# Patient Record
Sex: Female | Born: 1960 | Race: White | Hispanic: No | Marital: Married | Smoking: Never smoker
Health system: Southern US, Community
[De-identification: ages and names within clinical notes are randomized; demographics above are authoritative.]

## PROBLEM LIST (undated history)

## (undated) DIAGNOSIS — F329 Major depressive disorder, single episode, unspecified: Secondary | ICD-10-CM

## (undated) DIAGNOSIS — F419 Anxiety disorder, unspecified: Secondary | ICD-10-CM

## (undated) DIAGNOSIS — F32A Depression, unspecified: Secondary | ICD-10-CM

## (undated) DIAGNOSIS — I10 Essential (primary) hypertension: Secondary | ICD-10-CM

## (undated) DIAGNOSIS — M1990 Unspecified osteoarthritis, unspecified site: Secondary | ICD-10-CM

## (undated) DIAGNOSIS — D860 Sarcoidosis of lung: Secondary | ICD-10-CM

## (undated) DIAGNOSIS — E7800 Pure hypercholesterolemia, unspecified: Secondary | ICD-10-CM

## (undated) DIAGNOSIS — E119 Type 2 diabetes mellitus without complications: Secondary | ICD-10-CM

## (undated) DIAGNOSIS — G43909 Migraine, unspecified, not intractable, without status migrainosus: Secondary | ICD-10-CM

## (undated) DIAGNOSIS — K219 Gastro-esophageal reflux disease without esophagitis: Secondary | ICD-10-CM

## (undated) HISTORY — PX: CHOLECYSTECTOMY: SHX55

## (undated) HISTORY — PX: ABDOMINAL HYSTERECTOMY: SHX81

## (undated) HISTORY — PX: LUNG BIOPSY: SHX232

## (undated) HISTORY — PX: TONSILLECTOMY: SUR1361

## (undated) HISTORY — PX: BREAST BIOPSY: SHX20

## (undated) HISTORY — PX: APPENDECTOMY: SHX54

---

## 2004-08-15 ENCOUNTER — Ambulatory Visit: Payer: Self-pay | Admitting: Family Medicine

## 2004-09-23 ENCOUNTER — Ambulatory Visit: Payer: Self-pay | Admitting: Family Medicine

## 2004-11-27 ENCOUNTER — Ambulatory Visit: Payer: Self-pay | Admitting: Family Medicine

## 2004-12-12 ENCOUNTER — Ambulatory Visit: Payer: Self-pay | Admitting: Family Medicine

## 2005-06-11 ENCOUNTER — Ambulatory Visit: Payer: Self-pay | Admitting: Family Medicine

## 2005-12-11 ENCOUNTER — Ambulatory Visit: Payer: Self-pay | Admitting: Internal Medicine

## 2005-12-16 ENCOUNTER — Ambulatory Visit: Payer: Self-pay | Admitting: Internal Medicine

## 2006-06-17 ENCOUNTER — Ambulatory Visit: Payer: Self-pay | Admitting: Family Medicine

## 2007-02-21 ENCOUNTER — Ambulatory Visit: Payer: Self-pay | Admitting: Internal Medicine

## 2007-06-21 ENCOUNTER — Ambulatory Visit: Payer: Self-pay | Admitting: Internal Medicine

## 2007-06-28 ENCOUNTER — Ambulatory Visit: Payer: Self-pay | Admitting: Internal Medicine

## 2007-07-07 ENCOUNTER — Ambulatory Visit: Payer: Self-pay | Admitting: Internal Medicine

## 2007-08-01 ENCOUNTER — Ambulatory Visit: Payer: Self-pay | Admitting: Gynecology

## 2007-08-04 ENCOUNTER — Ambulatory Visit (HOSPITAL_COMMUNITY): Admission: RE | Admit: 2007-08-04 | Discharge: 2007-08-04 | Payer: Self-pay | Admitting: Gynecology

## 2007-08-25 ENCOUNTER — Ambulatory Visit: Payer: Self-pay | Admitting: Gynecology

## 2007-10-06 ENCOUNTER — Ambulatory Visit: Payer: Self-pay | Admitting: Gynecology

## 2007-10-06 ENCOUNTER — Encounter (INDEPENDENT_AMBULATORY_CARE_PROVIDER_SITE_OTHER): Payer: Self-pay | Admitting: Gynecology

## 2007-10-06 ENCOUNTER — Ambulatory Visit (HOSPITAL_COMMUNITY): Admission: RE | Admit: 2007-10-06 | Discharge: 2007-10-06 | Payer: Self-pay | Admitting: Gynecology

## 2007-10-26 ENCOUNTER — Ambulatory Visit: Payer: Self-pay | Admitting: Gynecology

## 2007-12-07 ENCOUNTER — Ambulatory Visit: Payer: Self-pay | Admitting: Internal Medicine

## 2008-05-30 ENCOUNTER — Ambulatory Visit: Payer: Self-pay | Admitting: Internal Medicine

## 2008-07-26 ENCOUNTER — Ambulatory Visit: Payer: Self-pay | Admitting: Internal Medicine

## 2008-08-27 ENCOUNTER — Ambulatory Visit: Payer: Self-pay | Admitting: Internal Medicine

## 2008-11-05 ENCOUNTER — Ambulatory Visit: Payer: Self-pay | Admitting: Family Medicine

## 2008-11-12 ENCOUNTER — Emergency Department: Payer: Self-pay | Admitting: Emergency Medicine

## 2008-11-13 ENCOUNTER — Inpatient Hospital Stay: Payer: Self-pay | Admitting: Internal Medicine

## 2009-01-26 IMAGING — US US PELV - US TRANSVAGINAL
1 series · 17 of 25 positions shown · non-contrast
Comparison: none

REASON FOR EXAM: pelvic pain
COMMENTS:

[Series 1: us pelv - us transvaginal · 17 of 36 slices shown]
[im 1/36]
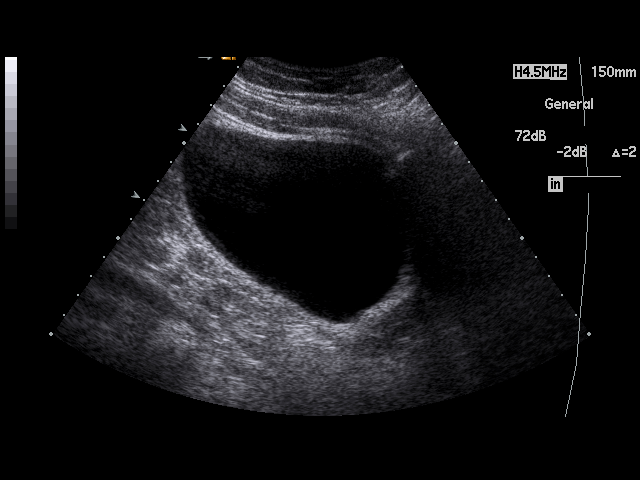
[im 3/36]
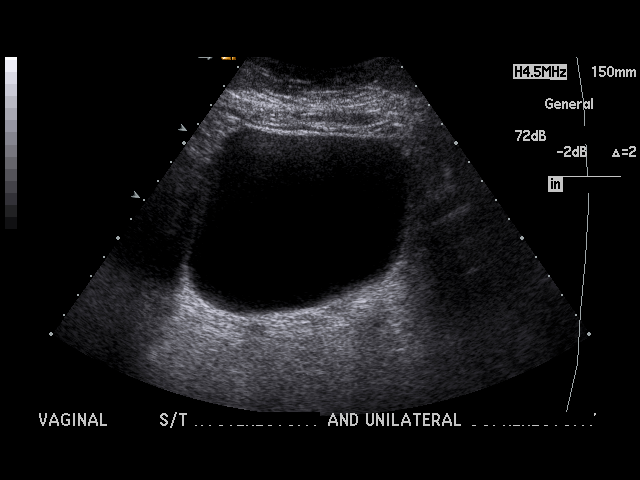
[im 5/36]
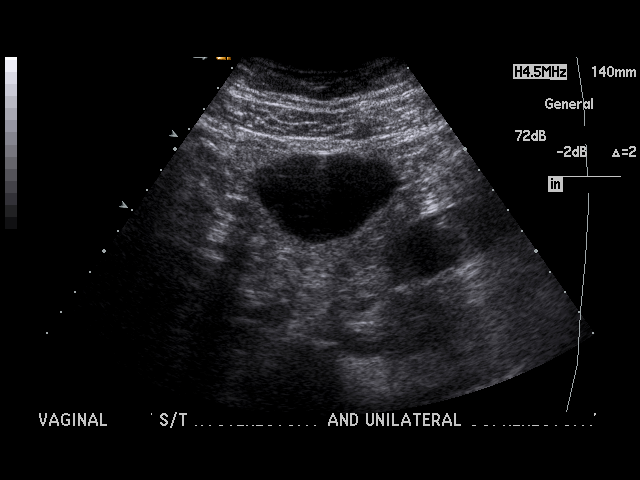
[im 8/36]
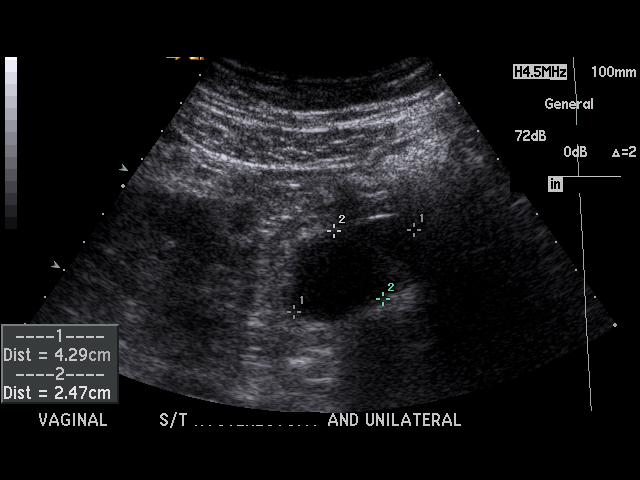
[im 9/36]
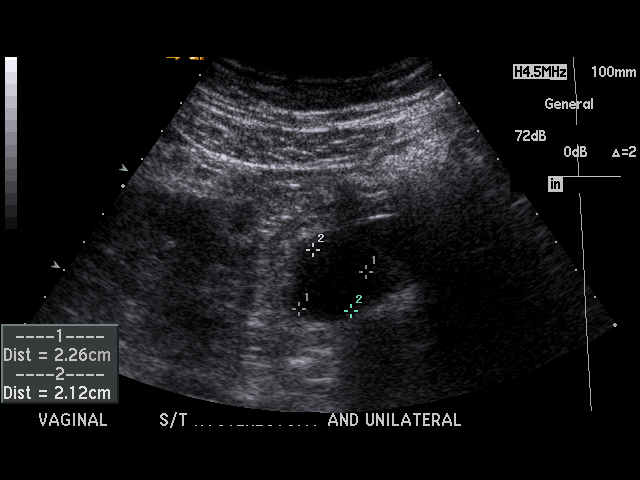
[im 12/36]
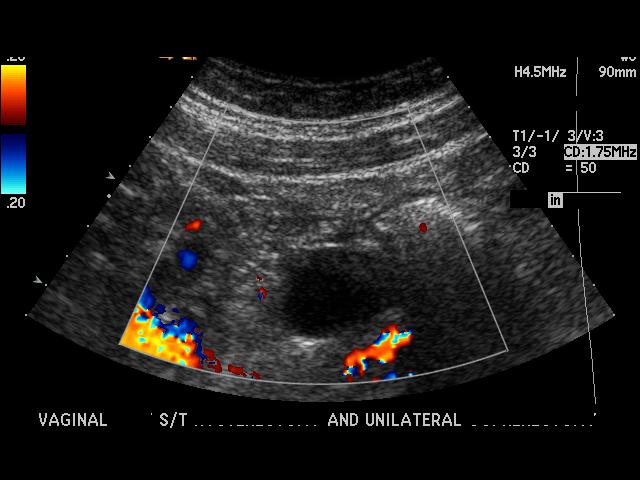
[im 14/36]
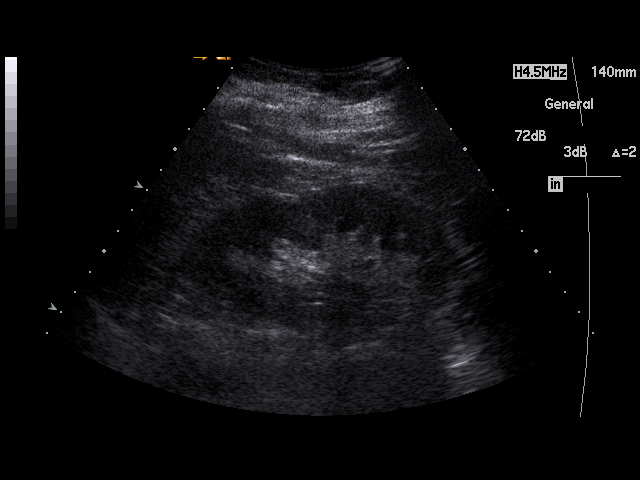
[im 17/36]
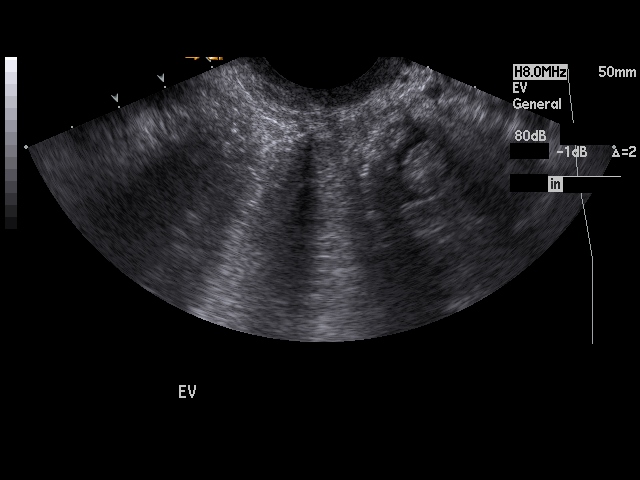
[im 18/36]
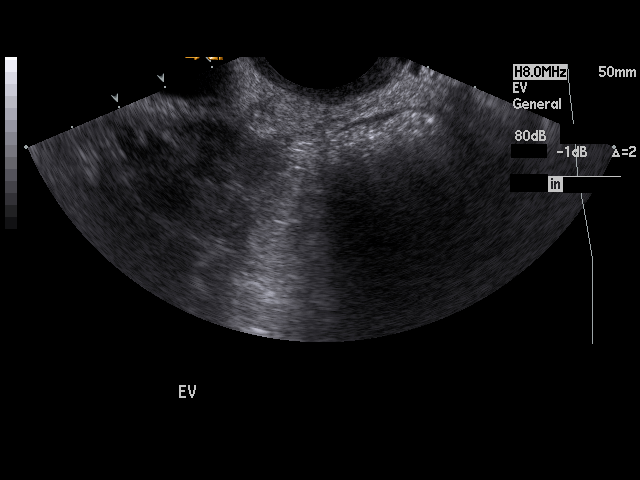
[im 19/36]
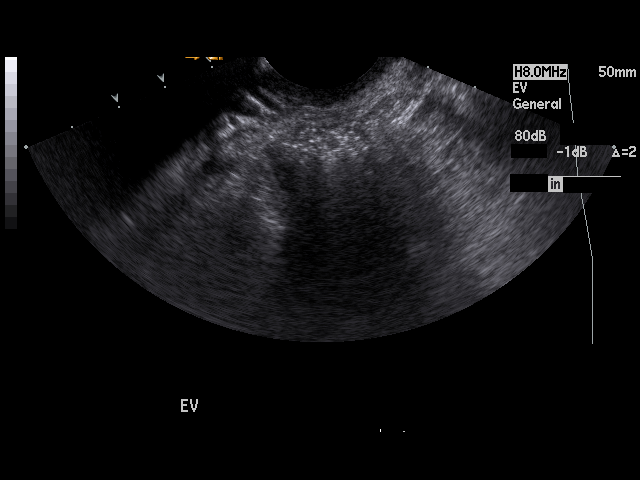
[im 22/36]
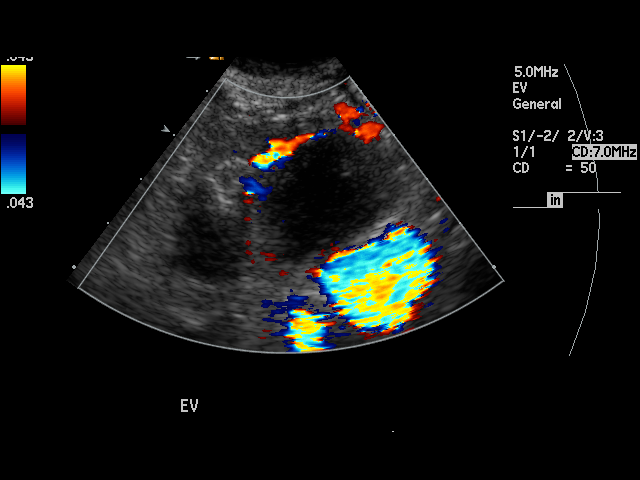
[im 24/36]
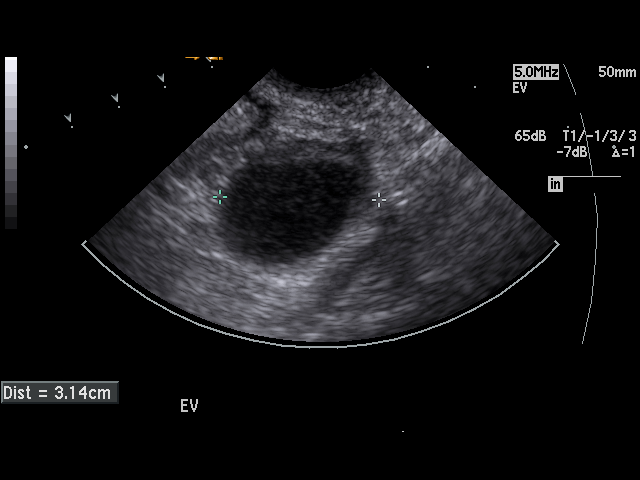
[im 27/36]
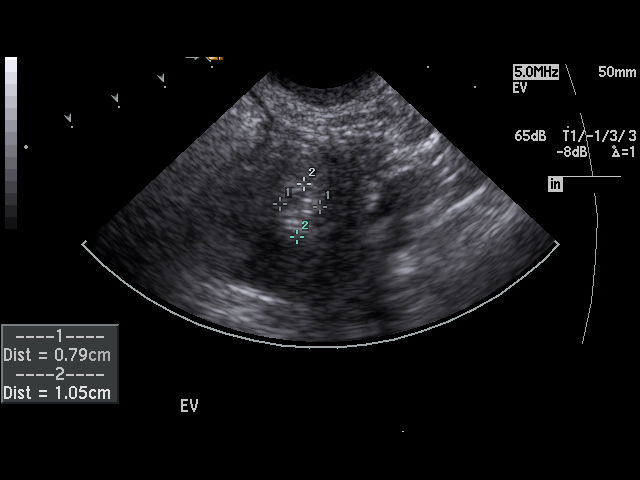
[im 28/36]
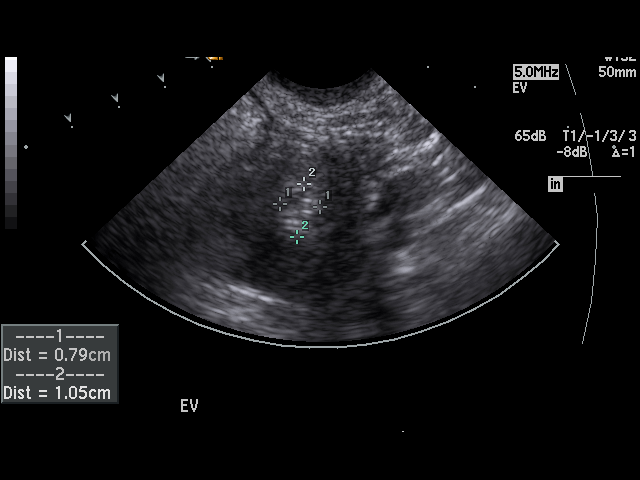
[im 31/36]
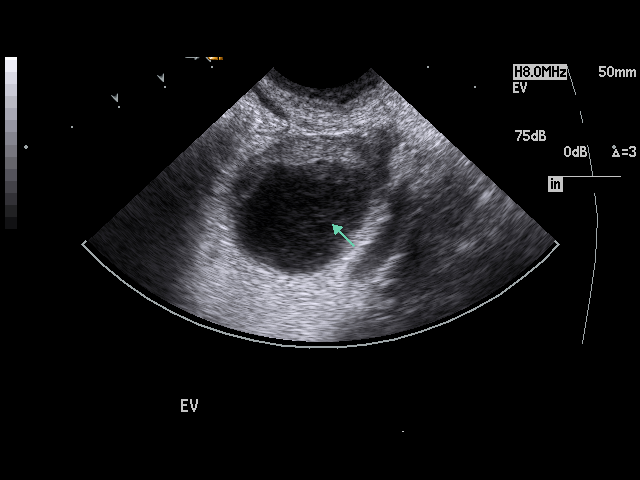
[im 33/36]
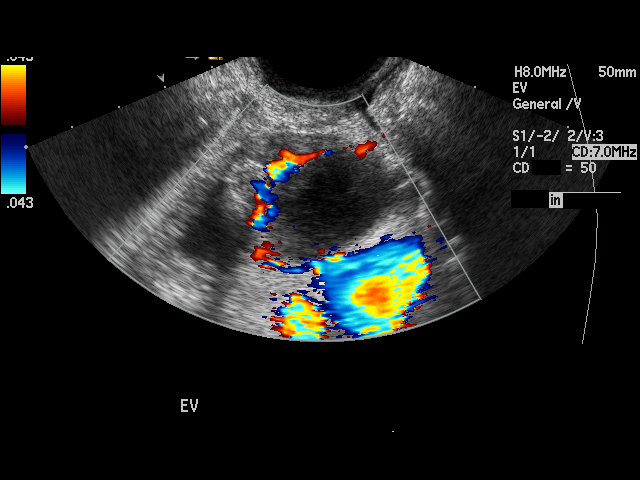
[im 36/36]
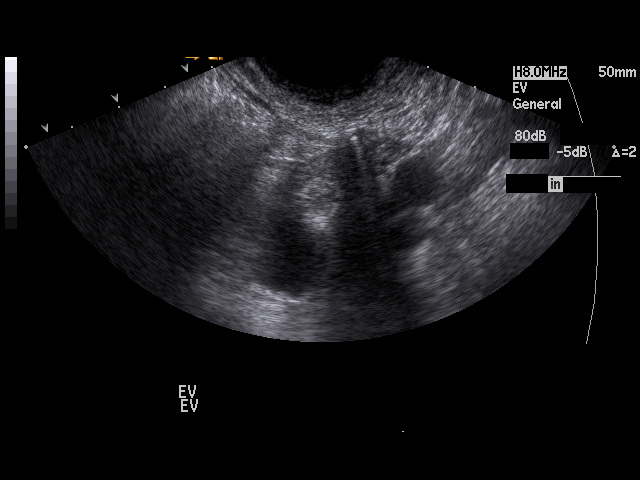

[17 of 25 positions shown; findings below may reference images not displayed]

PROCEDURE:     US  - US PELVIS MASS EXAM W/TRANSVAGI  - July 07, 2007  [DATE]

RESULT:     The patient is status post hysterectomy. The RIGHT ovary is not
seen compatible with prior RIGHT oophorectomy. The LEFT ovary is visualized
and measures 4.5 cm at maximum diameter. Within the LEFT ovary there are
noted two hypoechoic areas. The smaller appears to represent a simple cyst
and the larger is a complex cyst with an irregular margin. The larger cyst
may represent a hemorrhagic cyst but since all margins are not smooth,
follow-up examination to document resolution is suggested since conceivably
a necrotic ovarian tumor could produce similar findings. Also noted in the
ovary is a focal hyperechoic area located just anterior to the two cysts.
This focus is basically avascular. This focus measures 1.05 cm at maximum
diameter. The finding could represent a small solid avascular mass or
possibly a focal area of calcification in the ovary. No new free fluid is
seen in the pelvis. The visualized portion of the urinary bladder is normal
in appearance. The kidneys show no hydronephrosis.
IMPRESSION: 1. The patient is status post hysterectomy and RIGHT oophorectomy.
2. There are noted two cystic lesions of the LEFT ovary with the larger
being a complex cyst measuring 2.49 cm at maximum diameter and for which
follow-up is recommended.
3. There is a focal 1.05 cm hyperechoic area in the LEFT ovary anterior to
the cystic areas and which could either represent a solid avascular or
hypovascular ovarian nodule or possibly focal calcification.
4. There is no ascites.
5. Transabdominal and endovaginal ultrasound was performed.

## 2009-10-02 ENCOUNTER — Ambulatory Visit: Payer: Self-pay | Admitting: Nurse Practitioner

## 2010-01-13 ENCOUNTER — Ambulatory Visit: Payer: Self-pay | Admitting: Family Medicine

## 2010-01-16 ENCOUNTER — Emergency Department: Payer: Self-pay | Admitting: Emergency Medicine

## 2010-03-20 ENCOUNTER — Ambulatory Visit: Payer: Self-pay | Admitting: Unknown Physician Specialty

## 2010-05-04 ENCOUNTER — Ambulatory Visit: Payer: Self-pay | Admitting: Internal Medicine

## 2010-05-24 ENCOUNTER — Observation Stay: Payer: Self-pay | Admitting: Internal Medicine

## 2010-05-26 ENCOUNTER — Ambulatory Visit: Payer: Self-pay | Admitting: Internal Medicine

## 2010-05-30 ENCOUNTER — Ambulatory Visit: Payer: Self-pay | Admitting: Internal Medicine

## 2010-06-03 ENCOUNTER — Ambulatory Visit: Payer: Self-pay | Admitting: Internal Medicine

## 2010-06-10 ENCOUNTER — Telehealth: Payer: Self-pay

## 2010-06-10 DIAGNOSIS — R933 Abnormal findings on diagnostic imaging of other parts of digestive tract: Secondary | ICD-10-CM

## 2010-06-10 NOTE — Telephone Encounter (Signed)
Pt aware of the procedure date and time she has been mailed instructions to her home.  Meds reviewed and pt instructed.

## 2010-06-10 NOTE — Telephone Encounter (Signed)
Pt scheduled for EUS at [facility] 06/19/10 130 pm.  Pt is to call Nicki [place] between 1-3 the day before for arrival [phone] and go to [facility] sometime before the 16th for preregistration.   Need to review meds and instruct pt.  Records and order faxed to [facility].  [phone].

## 2010-06-17 NOTE — Op Note (Signed)
NAME:  Alexis Mccarthy, Alexis Mccarthy                ACCOUNT NO.:  [id number]   MEDICAL RECORD NO.:  [id number]          PATIENT TYPE:  AMB   LOCATION:  [facility]                           FACILITY:  [facility]   PHYSICIAN:  Ginger Carne, MD  DATE OF BIRTH:  11-Apr-1960   DATE OF PROCEDURE:  10/06/2007  DATE OF DISCHARGE:                               OPERATIVE REPORT   PREOPERATIVE DIAGNOSES:  1. Left ovarian mass.  2. 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  3. Chronic pelvic pain.  4. 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   POSTOPERATIVE DIAGNOSES:  1. Left ovarian mass.  2. Probable dermoid.  3. Normal right pelvic sidewall.  4. Normal-appearing appendix.   OPERATIVE PROCEDURES:  1. Laparoscopic left salpingo-oophorectomy.  2. Laparoscopic appendectomy.   SURGEON:  Ginger Carne, MD   ASSISTANT:  None.   COMPLICATIONS:  None immediate.   ESTIMATED BLOOD LOSS:  Minimal.   SPECIMENS:  Left tube and ovary and appendix.   OPERATIVE FINDINGS:  Laparoscopic evaluation revealed pelvic,  peritoneal, intestinal adhesions on both pelvic sidewalls.  The left  ovary was noted to be approximately 3-4 cm with an ultrasound diagnosis  consistent with possible dermoid cyst.  The ovary was adherent to its  respective sidewall as well as adhesive disease.  The right pelvic  sidewall was carefully dissected and no evidence of what appeared to be  an ovarian remnant was noted.  Scar tissue was observed; however, there  was no suggestion of actual ovarian tissue.  The appendix appeared  otherwise normal and was performed as an incidental finding.  Large and  small bowel grossly normal.  Upper abdomen, normal.   OPERATIVE PROCEDURE:  The patient prepped and draped in usual fashion  and placed in the lithotomy position.  Betadine solution used for  antiseptic and the patient was catheterized prior to procedure.  After  adequate general anesthesia, a vertical infraumbilical incision was made  and a Veress needle placed in  the abdomen.  Opening and closing  pressures were 10 and 15 mmHg.  Needle released, trocar placed in same  incision.  Laparoscope placed in the trocar sleeve.  Afterwards, two 5-  mm probes were placed in the left lower quadrant and left hypogastric  regions under direct visualization.  Meticulous attention was paid to  pelvic and intestinal adhesive disease dissection.  All adhesions were  lysed and were necessary to carefully visualize the left ureter and left  adnexal structures.  At this point, the left infundibulopelvic ligament  was bipolar cauterized and cut.  The remaining portions of the  attachment of the left tube and ovary to the pelvic sidewall were  severed and __________ removed with an Endopouch bag.  Active bleeding  points hemostatically checked and copious irrigation utilized.  The  right pelvic sidewall was carefully dissected with bowel carefully  removed from sub sidewall.  As described above, no evidence of ovarian  remnant was noted.  At this point, the appendix was visualized and  bipolar cauterization of the mesoappendix to the base followed.  Two 0  Vicryl loops were placed at the  base and then one about 8 mm above the  first 2.  Appendix then was cut above the first 2 sutures and removed  with an Endopouch bag.  Copious irrigation of the area with lactated  Ringer's followed.  No active bleeding anywhere noted, irrigant removed.  Gas released, trocars removed.  No active bleeding observed.  Closure of  the 10-mm fascia site with 0 Vicryl suture and 4-0 Vicryl for  subcuticular closure.  Instrument and sponge count were correct.  The  patient tolerated the procedure well, and returned to postanesthesia  recovery room in excellent condition.      Ginger Carne, MD  Electronically Signed     SHB/MEDQ  D:  10/06/2007  T:  10/07/2007  Job:  [id number]

## 2010-06-17 NOTE — Assessment & Plan Note (Signed)
NAME:  Alexis Mccarthy, Alexis Mccarthy                ACCOUNT NO.:  [id number]   MEDICAL RECORD NO.:  [id number]          PATIENT TYPE:  POB   LOCATION:  [facility] at [facility]         FACILITY:  [facility]   PHYSICIAN:  Ginger Carne, MD DATE OF BIRTH:  Jul 01, 1960   DATE OF SERVICE:  08/25/2007                                  CLINIC NOTE   This patient is a 50 year old gravida 3, para 2-0-1-2 Caucasian female  who demonstrates a questionable small right ovarian remnant and a left  ovarian dermoid tumor with a 3 to 32-month history of chronic pelvic  pain.  The patient had undergone a total vaginal hysterectomy and right  salpingo-oophorectomy in 2002 because of adenomyosis and abnormal  bleeding.  She demonstrates on an ultrasound obtained on August 04, 2007 by  the [facility] in [place] a left ovary measuring in largest  dimension 3.1 cm and compatible with a left ovarian dermoid tumor.  The  right adnexa demonstrates an area with largest dimension of 1.6 cm  suggestive of a small ovarian remnant.  The patient wishes to have  definitive management of same.  The pain has caused issues as far as  having a negative impact on her quality of life and dyspareunia.  She  denied GI, GU, or musculoskeletal symptomatology.   OB/GYN HISTORY:  The patient has had 2 full-term vaginal deliveries, one  first trimester incomplete abortion and a bilateral tubal ligation in  1983.  She has also had a laparoscopic procedure for lysis of adhesions  in 2000.   PAST SURGICAL HISTORY:  Laparoscopic cholecystectomy in 2004, total  vaginal hysterectomy and right salpingo-oophorectomy in 2002, two  laparoscopic procedures in the past, one for bilateral tubal ligation  and another one for lysis of adhesions.   ALLERGIES:  No to iodine, latex, food, or medication.   CURRENT MEDICATIONS:  1. Cymbalta 30 mg 1 daily.  2. Mobic 7.5 mg twice a day.   MEDICAL HISTORY:  1. Depression.  2. Arthritis.   FAMILY HISTORY:   Father has coronary artery heart disease and has had a  bypass surgery recently and type 2 diabetes mellitus.  Mother has  osteoporosis and COPD.   SOCIAL HISTORY:  The patient is a nonsmoker.  Denies alcohol or illicit  drug abuse.   REVIEW OF SYSTEMS:  A 14-point comprehensive review of systems within  normal limits.   PHYSICAL EXAMINATION:  VITAL SIGNS:  Blood pressure 147/92, weight 151  pounds, and height 5 feet 7 inches.  HEENT:  Grossly normal.  BREASTS:  Without masses, discharge, thickenings, or tenderness.  CHEST:  Clear to percussion and auscultation.  CARDIOVASCULAR:  Without murmurs.  Regular rate and rhythm.  EXTREMITIES, LYMPHATIC, SKIN, NEUROLOGICAL, AND MUSCULOSKELETAL:  Within  normal limits.  ABDOMEN:  Soft without hepatosplenomegaly.  PELVIC:  External genitalia, vulva, and vagina normal.  Cuff is smooth.  Cervix absent.  Both adnexa are tender but without fullness.  Rectovaginal exam confirmatory.   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   PLAN:  The patient was apprised as to the nature of findings and wishes  to have  definitive management.  Therefore, the patient will be scheduled  for a laparoscopic excision of right adnexal tissue and left salpingo-  oophorectomy as well as appendectomy.  Ashby Dawes of said procedure  discussed in detail.  Risks including injuries to ureter, bowel, and  bladder, possible conversion to an open procedure, hemorrhage possibly  requiring blood transfusion, infection, and other unforeseen  complications were discussed and understood by said patient.  In  addition, the patient understands she may have bilateral ureteral  catheters placed in the event of difficulty in identifying either  ureter.           ______________________________  Ginger Carne, MD     SHB/MEDQ  D:  08/25/2007  T:  08/26/2007  Job:  [id number]

## 2010-06-17 NOTE — Assessment & Plan Note (Signed)
NAME:  Alexis Mccarthy, Alexis Mccarthy                ACCOUNT NO.:  [id number]   MEDICAL RECORD NO.:  [id number]          PATIENT TYPE:  POB   LOCATION:  [facility] at [facility]         FACILITY:  [facility]   PHYSICIAN:  Ginger Carne, MD DATE OF BIRTH:  1960/11/11   DATE OF SERVICE:                                  CLINIC NOTE   Ms. Bukowski is a 50 year old Caucasian female status post total vaginal  hysterectomy and right salpingo-oophorectomy in August 2002 because of  endometriosis and adenomyosis who presents today because of an  ultrasound performed at Dr. Dalphine Handing office on July 07, 2007, which  demonstrated 2 complex cystic masses being 2.5 and 1.05 cm on the left  ovary.  She states that on examination she had had tenderness which  prompted the said ultrasound.  Otherwise, she has minimal discomfort on  that site.   The patient denies GI, GU, or musculoskeletal symptomatology.  Ultrasound scan performed here today revealed a different appearing  lesion on the left adnexa, 4-5 cm tubular structure was noted separating  apart from the bladder which contained what appeared to be hyperechoic  signals.  Upon looking at this, it is unclear whether this represents  her tube and/or ovary.   PLAN:  I have asked the patient to have an ultrasound at [facility] of the pelvis to determine the etiology of this mass.  Her left  tube and ovary had been retained following her surgery.  The patient  will be contacted after ultrasound report is available.           ______________________________  Ginger Carne, MD     SHB/MEDQ  D:  08/01/2007  T:  08/02/2007  Job:  [id number]

## 2010-06-17 NOTE — Assessment & Plan Note (Signed)
NAME:  Alexis Mccarthy, Alexis Mccarthy                ACCOUNT NO.:  [id number]   MEDICAL RECORD NO.:  [id number]          PATIENT TYPE:  POB   LOCATION:  [facility] at [facility]         FACILITY:  [facility]   PHYSICIAN:  Ginger Carne, MD DATE OF BIRTH:  [birth year]   DATE OF SERVICE:  10/25/2007                                  CLINIC NOTE   [name] returns today following a laparoscopic left salpingo-  oophorectomy and appendectomy because of a chronic pelvic pain.  Her  pathology report revealed a normal appendix and a mature cystic teratoma  within ovary that had multiple benign cystic follicles.  She had no  evidence of a right ovarian remnant syndrome as was indicated on her  ultrasound.  The patient is doing well today.  Her incisions are dry,  well-healed.  She has no complaints and feeling well.  The patient is  taking Premarin 1.25 mg daily and she will return on a p.r.n. basis.           ______________________________  Ginger Carne, MD     SHB/MEDQ  D:  10/26/2007  T:  10/26/2007  Job:  [id number]

## 2010-06-19 ENCOUNTER — Encounter: Payer: Self-pay | Admitting: Gastroenterology

## 2010-06-19 ENCOUNTER — Ambulatory Visit: Payer: Self-pay

## 2010-06-20 NOTE — H&P (Signed)
NAME:  Alexis Mccarthy, Alexis Mccarthy NO.:  [id number]   MEDICAL RECORD NO.:  [id number]           PATIENT TYPE:   LOCATION:                                 FACILITY:   PHYSICIAN:  Ginger Carne, MD  DATE OF BIRTH:  08/21/1960   DATE OF ADMISSION:  DATE OF DISCHARGE:                              HISTORY & PHYSICAL   This patient is a 50 year old gravida 3, para 2-0-1-2 Caucasian female  who demonstrates a questionable right ovarian remnant and the left  ovarian dermoid mass with a 3- to 66-month history of chronic pelvic  pain.  The patient had undergone a total vaginal hysterectomy and right  salpingo-oophorectomy in 2002 because of adenomyosis and abnormal  bleeding.  She demonstrates on an ultrasound obtained on August 04, 2007,  by [facility] in [place].  The left ovary measuring in largest  dimension at 3.1 cm and compatible with the left ovarian dermoid tumor.  The right adnexa demonstrates an area with largest dimension of 1.6 cm  suggestive of a small ovarian remnant.  The patient wishes to have  definitive management of the same.  The patient has noted that her  discomfort has caused a negative impact in her quality of life in  addition to dyspareunia.  She denies GI, GU, or musculoskeletal  complaints or symptomatology.   OB/GYN HISTORY:  The patient has had 2 full term vaginal deliveries, one  first trimester and complete abortion and a bilateral tubal ligation in  1983.  She has also had a laparoscopic procedure for lysis of adhesions  in 2000.   PAST SURGICAL HISTORY:  Laparoscopic cholecystectomy in 2004.  Total  vaginal hysterectomy and right salpingo-oophorectomy in 2002.  Two  laparoscopic procedures in the past, one for bilateral tubal ligation  and another for a lysis of adhesions.   ALLERGIES:  None to iodine, latex, food, and medication.   CURRENT MEDICATIONS:  1. Cymbalta 30 mg daily.  2. Mobic 7.5 mg twice a day.   PAST MEDICAL HISTORY:  1. Depression.  2. Arthritis.   FAMILY HISTORY:  Father has coronary artery heart disease and has had a  bypass surgery recently and type 2 diabetes mellitus.  Mother has  osteoporosis and COPD.   SOCIAL HISTORY:  The patient is a nonsmoker, denies alcohol, or illicit  drug abuse.   REVIEW OF SYSTEMS:  A 14-point comprehensive review of systems within  normal limits.   PHYSICAL EXAMINATION:  VITAL SIGNS:  Blood pressure 147/92, weight 151  pounds, and height 5 feet 7 inches.  HEENT:  Grossly normal.  BREASTS:  Without masses, discharge, thickenings, or tenderness.  CHEST:  Clear to percussion and auscultation.  CARDIOVASCULAR:  Without murmurs or enlargement.  Regular rate and  rhythm.  EXTREMITIES/LYMPHATICS/SKIN/NEUROLOGICAL/MUSCULOSKELETAL SYSTEMS:  Within normal limits.  ABDOMEN:  Soft without gross hepatosplenomegaly.  PELVIC EXAM:  External genitalia, vulva, and vagina normal.  The cuff is  smooth with an absent cervix.  Both adnexa are tender but without masses  or fullness.  Rectovaginal exam was confirmatory.   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   PLAN:  The patient was apprised of the nature of findings from  ultrasound, wishes to have definitive management.  Therefore, the  patient will be scheduled for laparoscopic excision of right adnexal  tissue if noted and a left salpingo-oophorectomy as well as an  appendectomy.  Ashby Dawes of said procedure discussed in detail.  Risks  including possible injuries to ureter, bowel and bladder, possible  conversion to an open procedure, hemorrhage requiring a blood  transfusion, infection, and other unforeseen complications were  discussed and understood by the patient.  In addition, the patient  understands she may have bilateral ureteral catheters placed in the  event of difficulty in identifying either ureter.      Ginger Carne, MD   Electronically Signed     SHB/MEDQ  D:  10/28/2007  T:  10/29/2007  Job:  [id number]

## 2010-06-23 ENCOUNTER — Encounter: Payer: Self-pay | Admitting: Gastroenterology

## 2010-06-24 LAB — PATHOLOGY REPORT

## 2010-06-26 ENCOUNTER — Encounter: Payer: Self-pay | Admitting: Gastroenterology

## 2010-06-27 ENCOUNTER — Ambulatory Visit: Payer: Self-pay

## 2010-07-01 ENCOUNTER — Encounter: Payer: Self-pay | Admitting: Gastroenterology

## 2010-07-02 ENCOUNTER — Inpatient Hospital Stay: Payer: Self-pay

## 2010-07-04 ENCOUNTER — Ambulatory Visit: Payer: Self-pay | Admitting: Internal Medicine

## 2010-07-08 LAB — PATHOLOGY REPORT

## 2010-08-03 ENCOUNTER — Ambulatory Visit: Payer: Self-pay | Admitting: Internal Medicine

## 2010-08-28 ENCOUNTER — Ambulatory Visit: Payer: Self-pay | Admitting: Internal Medicine

## 2010-09-03 ENCOUNTER — Ambulatory Visit: Payer: Self-pay | Admitting: Internal Medicine

## 2010-09-26 ENCOUNTER — Ambulatory Visit: Payer: Self-pay | Admitting: Unknown Physician Specialty

## 2010-09-29 LAB — PATHOLOGY REPORT

## 2010-10-07 ENCOUNTER — Ambulatory Visit: Payer: Self-pay | Admitting: Family Medicine

## 2010-11-05 LAB — CBC
HCT: 41
MCHC: 34.1
Platelets: 340
RDW: 12

## 2010-11-05 LAB — BASIC METABOLIC PANEL
BUN: 7
CO2: 32
GFR calc non Af Amer: >60
Glucose, Bld: 94
Potassium: 3.6

## 2010-11-06 ENCOUNTER — Ambulatory Visit: Payer: Self-pay | Admitting: Internal Medicine

## 2010-12-04 ENCOUNTER — Ambulatory Visit: Payer: Self-pay | Admitting: Internal Medicine

## 2011-01-03 ENCOUNTER — Ambulatory Visit: Payer: Self-pay | Admitting: Internal Medicine

## 2011-01-30 ENCOUNTER — Ambulatory Visit: Payer: Self-pay | Admitting: Unknown Physician Specialty

## 2011-02-03 ENCOUNTER — Ambulatory Visit: Payer: Self-pay | Admitting: Internal Medicine

## 2011-07-09 ENCOUNTER — Ambulatory Visit: Payer: Self-pay | Admitting: Internal Medicine

## 2011-08-03 ENCOUNTER — Ambulatory Visit: Payer: Self-pay | Admitting: Internal Medicine

## 2011-09-20 ENCOUNTER — Emergency Department: Payer: Self-pay | Admitting: Emergency Medicine

## 2011-09-20 LAB — BASIC METABOLIC PANEL
Anion Gap: 10 (ref 7–16)
Chloride: 101 mmol/L (ref 98–107)
Co2: 29 mmol/L (ref 21–32)
Creatinine: 0.75 mg/dL (ref 0.60–1.30)
Sodium: 140 mmol/L (ref 136–145)

## 2011-09-20 LAB — HEPATIC FUNCTION PANEL A (ARMC)
Alkaline Phosphatase: 78 U/L (ref 50–136)
Bilirubin,Total: 0.2 mg/dL (ref 0.2–1.0)

## 2011-09-20 LAB — CK TOTAL AND CKMB (NOT AT ARMC)
CK, Total: 90 U/L (ref 21–215)
CK-MB: 0.6 ng/mL (ref 0.5–3.6)

## 2011-09-20 LAB — TROPONIN I: Troponin-I: 0.03 ng/mL

## 2011-09-20 LAB — CBC
RBC: 4.18 10*6/uL (ref 3.80–5.20)
WBC: 10.1 10*3/uL (ref 3.6–11.0)

## 2011-10-08 ENCOUNTER — Ambulatory Visit: Payer: Self-pay | Admitting: Family Medicine

## 2012-04-07 ENCOUNTER — Emergency Department: Payer: Self-pay | Admitting: Emergency Medicine

## 2012-04-07 LAB — CBC WITH DIFFERENTIAL/PLATELET
Basophil #: 0.1 10*3/uL (ref 0.0–0.1)
Eosinophil #: 0.2 10*3/uL (ref 0.0–0.7)
Lymphocyte %: 21.2 %
MCH: 30.1 pg (ref 26.0–34.0)
MCHC: 33.6 g/dL (ref 32.0–36.0)
MCV: 90 fL (ref 80–100)
Monocyte #: 1 x10 3/mm — ABNORMAL HIGH (ref 0.2–0.9)
Monocyte %: 7.3 %
Neutrophil #: 9.3 10*3/uL — ABNORMAL HIGH (ref 1.4–6.5)
Neutrophil %: 69.5 %
Platelet: 329 10*3/uL (ref 150–440)
RDW: 13.4 % (ref 11.5–14.5)

## 2012-04-07 LAB — BASIC METABOLIC PANEL
Chloride: 104 mmol/L (ref 98–107)
Co2: 31 mmol/L (ref 21–32)
EGFR (African American): >60
Glucose: 122 mg/dL — ABNORMAL HIGH (ref 65–99)
Osmolality: 279 (ref 275–301)

## 2012-10-10 ENCOUNTER — Ambulatory Visit: Payer: Self-pay | Admitting: Nurse Practitioner

## 2012-10-14 ENCOUNTER — Emergency Department: Payer: Self-pay | Admitting: Internal Medicine

## 2012-10-14 LAB — COMPREHENSIVE METABOLIC PANEL
Anion Gap: 8 (ref 7–16)
BUN: 10 mg/dL (ref 7–18)
Bilirubin,Total: 0.4 mg/dL (ref 0.2–1.0)
Calcium, Total: 9.1 mg/dL (ref 8.5–10.1)
Chloride: 103 mmol/L (ref 98–107)
EGFR (Non-African Amer.): >60
Osmolality: 277 (ref 275–301)

## 2012-10-14 LAB — URINALYSIS, COMPLETE
Bacteria: NONE SEEN
Glucose,UR: NEGATIVE mg/dL (ref 0–75)
Leukocyte Esterase: NEGATIVE
Specific Gravity: 1.014 (ref 1.003–1.030)
WBC UR: 1 /HPF (ref 0–5)

## 2012-10-14 LAB — CBC
MCH: 30.8 pg (ref 26.0–34.0)
MCHC: 34.7 g/dL (ref 32.0–36.0)
Platelet: 319 10*3/uL (ref 150–440)
WBC: 9.4 10*3/uL (ref 3.6–11.0)

## 2012-10-14 LAB — LIPASE, BLOOD: Lipase: 73 U/L (ref 73–393)

## 2013-03-27 ENCOUNTER — Emergency Department: Payer: Self-pay | Admitting: Emergency Medicine

## 2013-03-27 LAB — CBC
HCT: 37.2 % (ref 35.0–47.0)
HGB: 13 g/dL (ref 12.0–16.0)
MCH: 31.1 pg (ref 26.0–34.0)
MCHC: 34.9 g/dL (ref 32.0–36.0)
MCV: 89 fL (ref 80–100)
PLATELETS: 334 10*3/uL (ref 150–440)
RBC: 4.18 10*6/uL (ref 3.80–5.20)
RDW: 13.2 % (ref 11.5–14.5)
WBC: 8.5 10*3/uL (ref 3.6–11.0)

## 2013-03-27 LAB — BASIC METABOLIC PANEL
Anion Gap: 4 — ABNORMAL LOW (ref 7–16)
BUN: 12 mg/dL (ref 7–18)
CALCIUM: 8.8 mg/dL (ref 8.5–10.1)
CO2: 31 mmol/L (ref 21–32)
Chloride: 102 mmol/L (ref 98–107)
Creatinine: 0.94 mg/dL (ref 0.60–1.30)
EGFR (African American): >60
EGFR (Non-African Amer.): >60
Glucose: 123 mg/dL — ABNORMAL HIGH (ref 65–99)
OSMOLALITY: 275 (ref 275–301)
Potassium: 3.5 mmol/L (ref 3.5–5.1)
Sodium: 137 mmol/L (ref 136–145)

## 2013-03-27 LAB — TROPONIN I: TROPONIN-I: 0.04 ng/mL

## 2013-03-28 LAB — TROPONIN I: TROPONIN-I: 0.04 ng/mL

## 2013-03-28 LAB — TSH: THYROID STIMULATING HORM: 1.62 u[IU]/mL

## 2013-03-28 LAB — MAGNESIUM: Magnesium: 1.4 mg/dL — ABNORMAL LOW

## 2014-01-05 ENCOUNTER — Ambulatory Visit: Payer: Self-pay | Admitting: Otolaryngology

## 2014-01-31 ENCOUNTER — Ambulatory Visit: Payer: Self-pay | Admitting: Otolaryngology

## 2014-03-16 ENCOUNTER — Emergency Department: Payer: Self-pay | Admitting: Emergency Medicine

## 2014-04-11 ENCOUNTER — Ambulatory Visit: Payer: Self-pay | Admitting: Nurse Practitioner

## 2014-10-07 ENCOUNTER — Encounter: Payer: Self-pay | Admitting: *Deleted

## 2014-10-07 DIAGNOSIS — I10 Essential (primary) hypertension: Secondary | ICD-10-CM | POA: Insufficient documentation

## 2014-10-07 DIAGNOSIS — T502X5A Adverse effect of carbonic-anhydrase inhibitors, benzothiadiazides and other diuretics, initial encounter: Secondary | ICD-10-CM | POA: Diagnosis not present

## 2014-10-07 DIAGNOSIS — G43909 Migraine, unspecified, not intractable, without status migrainosus: Secondary | ICD-10-CM | POA: Diagnosis not present

## 2014-10-07 DIAGNOSIS — R51 Headache: Secondary | ICD-10-CM | POA: Diagnosis present

## 2014-10-07 NOTE — ED Notes (Signed)
Pt presents w/ c/o headache starting today. Pt also c/o HTN. Pt checked B/P at home after beginning to have a headache. Pt had recent med change to her HTN meds. Pt started her new meds today.

## 2014-10-08 ENCOUNTER — Emergency Department
Admission: EM | Admit: 2014-10-08 | Discharge: 2014-10-08 | Discharge status: Against Medical Advice | Payer: BC Managed Care – PPO | Attending: Emergency Medicine | Admitting: Emergency Medicine

## 2014-10-08 HISTORY — DX: Essential (primary) hypertension: I10

## 2014-10-08 HISTORY — DX: Sarcoidosis of lung: D86.0

## 2014-10-08 HISTORY — DX: Migraine, unspecified, not intractable, without status migrainosus: G43.909

## 2014-10-08 HISTORY — DX: Anxiety disorder, unspecified: F41.9

## 2014-10-08 HISTORY — DX: Pure hypercholesterolemia, unspecified: E78.00

## 2014-10-08 LAB — TROPONIN I

## 2014-10-08 LAB — BASIC METABOLIC PANEL
ANION GAP: 10 (ref 5–15)
BUN: 15 mg/dL (ref 6–20)
CHLORIDE: 99 mmol/L — AB (ref 101–111)
CO2: 29 mmol/L (ref 22–32)
Calcium: 9.7 mg/dL (ref 8.9–10.3)
Creatinine, Ser: 0.92 mg/dL (ref 0.44–1.00)
GFR calc non Af Amer: >60 mL/min (ref >60)
GLUCOSE: 147 mg/dL — AB (ref 65–99)
POTASSIUM: 3.2 mmol/L — AB (ref 3.5–5.1)
Sodium: 138 mmol/L (ref 135–145)

## 2014-10-08 LAB — CBC
HEMATOCRIT: 40.3 % (ref 35.0–47.0)
HEMOGLOBIN: 13.9 g/dL (ref 12.0–16.0)
MCH: 30.8 pg (ref 26.0–34.0)
MCHC: 34.5 g/dL (ref 32.0–36.0)
MCV: 89 fL (ref 80.0–100.0)
Platelets: 340 10*3/uL (ref 150–440)
RBC: 4.52 MIL/uL (ref 3.80–5.20)
RDW: 13.1 % (ref 11.5–14.5)
WBC: 9.8 10*3/uL (ref 3.6–11.0)

## 2014-10-08 NOTE — ED Notes (Signed)
Pt states that her headache is gone, states that she takes her bp med when she wakes up every am, pt states that she is ready to go.

## 2014-10-08 NOTE — ED Notes (Signed)
Pt not wanting to stay any longer to be seen by a dr, pt states that her headache is completely gone, pt reports hx of migraines and states that she took her imitrex before coming, pt also states that she started hctz today and shortly after taking the med she developed the headache, pt states that she is going to call her dr and discuss her s/s before resuming her hctz, pt encouraged to retrun to the er if her s/s returned of if she had any neurological symptoms or anything concerning her. Pt verbalized understanding

## 2015-04-03 DIAGNOSIS — E119 Type 2 diabetes mellitus without complications: Secondary | ICD-10-CM | POA: Insufficient documentation

## 2015-04-05 ENCOUNTER — Other Ambulatory Visit: Payer: Self-pay | Admitting: Nurse Practitioner

## 2015-04-05 DIAGNOSIS — Z1231 Encounter for screening mammogram for malignant neoplasm of breast: Secondary | ICD-10-CM

## 2015-04-06 ENCOUNTER — Emergency Department
Admission: EM | Admit: 2015-04-06 | Discharge: 2015-04-06 | Disposition: A | Discharge status: Home / Self Care | Payer: BC Managed Care – PPO | Attending: Emergency Medicine | Admitting: Emergency Medicine

## 2015-04-06 ENCOUNTER — Encounter: Payer: Self-pay | Admitting: Emergency Medicine

## 2015-04-06 ENCOUNTER — Emergency Department: Payer: BC Managed Care – PPO

## 2015-04-06 DIAGNOSIS — I10 Essential (primary) hypertension: Secondary | ICD-10-CM | POA: Diagnosis present

## 2015-04-06 LAB — CBC
HCT: 37.6 % (ref 35.0–47.0)
HEMOGLOBIN: 13.2 g/dL (ref 12.0–16.0)
MCH: 31.2 pg (ref 26.0–34.0)
MCHC: 35.1 g/dL (ref 32.0–36.0)
MCV: 88.9 fL (ref 80.0–100.0)
PLATELETS: 310 10*3/uL (ref 150–440)
RBC: 4.24 MIL/uL (ref 3.80–5.20)
RDW: 12.6 % (ref 11.5–14.5)
WBC: 10.1 10*3/uL (ref 3.6–11.0)

## 2015-04-06 LAB — COMPREHENSIVE METABOLIC PANEL
ALT: 24 U/L (ref 14–54)
AST: 28 U/L (ref 15–41)
Albumin: 4.2 g/dL (ref 3.5–5.0)
Alkaline Phosphatase: 61 U/L (ref 38–126)
Anion gap: 8 (ref 5–15)
BILIRUBIN TOTAL: 0.3 mg/dL (ref 0.3–1.2)
BUN: 15 mg/dL (ref 6–20)
CHLORIDE: 103 mmol/L (ref 101–111)
CO2: 28 mmol/L (ref 22–32)
CREATININE: 0.67 mg/dL (ref 0.44–1.00)
Calcium: 9 mg/dL (ref 8.9–10.3)
Glucose, Bld: 150 mg/dL — ABNORMAL HIGH (ref 65–99)
Potassium: 3.2 mmol/L — ABNORMAL LOW (ref 3.5–5.1)
Sodium: 139 mmol/L (ref 135–145)
TOTAL PROTEIN: 7.4 g/dL (ref 6.5–8.1)

## 2015-04-06 LAB — TROPONIN I

## 2015-04-06 NOTE — ED Notes (Signed)
States has been monitoring blood pressure today and getting high readings ie 160's/108. States L arm pain since yesterday.

## 2015-04-06 NOTE — Discharge Instructions (Signed)
Hypertension Hypertension, commonly called high blood pressure, is when the force of blood pumping through your arteries is too strong. Your arteries are the blood vessels that carry blood from your heart throughout your body. A blood pressure reading consists of a higher number over a lower number, such as 110/72. The higher number (systolic) is the pressure inside your arteries when your heart pumps. The lower number (diastolic) is the pressure inside your arteries when your heart relaxes. Ideally you want your blood pressure below 120/80. Hypertension forces your heart to work harder to pump blood. Your arteries may become narrow or stiff. Having untreated or uncontrolled hypertension can cause heart attack, stroke, kidney disease, and other problems. RISK FACTORS Some risk factors for high blood pressure are controllable. Others are not.  Risk factors you cannot control include:   Race. You may be at higher risk if you are African American.  Age. Risk increases with age.  Gender. Men are at higher risk than women before age 45 years. After age 65, women are at higher risk than men. Risk factors you can control include:  Not getting enough exercise or physical activity.  Being overweight.  Getting too much fat, sugar, calories, or salt in your diet.  Drinking too much alcohol. SIGNS AND SYMPTOMS Hypertension does not usually cause signs or symptoms. Extremely high blood pressure (hypertensive crisis) may cause headache, anxiety, shortness of breath, and nosebleed. DIAGNOSIS To check if you have hypertension, your health care provider will measure your blood pressure while you are seated, with your arm held at the level of your heart. It should be measured at least twice using the same arm. Certain conditions can cause a difference in blood pressure between your right and left arms. A blood pressure reading that is higher than normal on one occasion does not mean that you need treatment. If  it is not clear whether you have high blood pressure, you may be asked to return on a different day to have your blood pressure checked again. Or, you may be asked to monitor your blood pressure at home for 1 or more weeks. TREATMENT Treating high blood pressure includes making lifestyle changes and possibly taking medicine. Living a healthy lifestyle can help lower high blood pressure. You may need to change some of your habits. Lifestyle changes may include:  Following the DASH diet. This diet is high in fruits, vegetables, and whole grains. It is low in salt, red meat, and added sugars.  Keep your sodium intake below 2,300 mg per day.  Getting at least 30-45 minutes of aerobic exercise at least 4 times per week.  Losing weight if necessary.  Not smoking.  Limiting alcoholic beverages.  Learning ways to reduce stress. Your health care provider may prescribe medicine if lifestyle changes are not enough to get your blood pressure under control, and if one of the following is true:  You are 18-59 years of age and your systolic blood pressure is above 140.  You are 60 years of age or older, and your systolic blood pressure is above 150.  Your diastolic blood pressure is above 90.  You have diabetes, and your systolic blood pressure is over 140 or your diastolic blood pressure is over 90.  You have kidney disease and your blood pressure is above 140/90.  You have heart disease and your blood pressure is above 140/90. Your personal target blood pressure may vary depending on your medical conditions, your age, and other factors. HOME CARE INSTRUCTIONS    Have your blood pressure rechecked as directed by your health care provider.   Take medicines only as directed by your health care provider. Follow the directions carefully. Blood pressure medicines must be taken as prescribed. The medicine does not work as well when you skip doses. Skipping doses also puts you at risk for  problems.  Do not smoke.   Monitor your blood pressure at home as directed by your health care provider. SEEK MEDICAL CARE IF:   You think you are having a reaction to medicines taken.  You have recurrent headaches or feel dizzy.  You have swelling in your ankles.  You have trouble with your vision. SEEK IMMEDIATE MEDICAL CARE IF:  You develop a severe headache or confusion.  You have unusual weakness, numbness, or feel faint.  You have severe chest or abdominal pain.  You vomit repeatedly.  You have trouble breathing. MAKE SURE YOU:   Understand these instructions.  Will watch your condition.  Will get help right away if you are not doing well or get worse.   This information is not intended to replace advice given to you by your health care provider. Make sure you discuss any questions you have with your health care provider.   Document Released: 01/19/2005 Document Revised: 06/05/2014 Document Reviewed: 11/11/2012 Elsevier Interactive Patient Education 2016 [employer]  
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
## 2015-04-15 ENCOUNTER — Ambulatory Visit
Admission: RE | Admit: 2015-04-15 | Discharge: 2015-04-15 | Disposition: A | Discharge status: Home / Self Care | Payer: BC Managed Care – PPO | Source: Ambulatory Visit | Attending: Nurse Practitioner | Admitting: Nurse Practitioner

## 2015-04-15 DIAGNOSIS — Z1231 Encounter for screening mammogram for malignant neoplasm of breast: Secondary | ICD-10-CM

## 2015-04-17 ENCOUNTER — Ambulatory Visit: Payer: BC Managed Care – PPO | Admitting: Psychiatry

## 2015-04-24 ENCOUNTER — Ambulatory Visit: Payer: BC Managed Care – PPO | Admitting: *Deleted

## 2016-03-08 ENCOUNTER — Emergency Department
Admission: EM | Admit: 2016-03-08 | Discharge: 2016-03-08 | Disposition: A | Discharge status: Home / Self Care | Payer: BC Managed Care – PPO | Attending: Emergency Medicine | Admitting: Emergency Medicine

## 2016-03-08 DIAGNOSIS — J029 Acute pharyngitis, unspecified: Secondary | ICD-10-CM | POA: Diagnosis present

## 2016-03-08 DIAGNOSIS — R112 Nausea with vomiting, unspecified: Secondary | ICD-10-CM

## 2016-03-08 DIAGNOSIS — J111 Influenza due to unidentified influenza virus with other respiratory manifestations: Secondary | ICD-10-CM | POA: Diagnosis not present

## 2016-03-08 DIAGNOSIS — I10 Essential (primary) hypertension: Secondary | ICD-10-CM | POA: Diagnosis not present

## 2016-03-08 DIAGNOSIS — K120 Recurrent oral aphthae: Secondary | ICD-10-CM | POA: Diagnosis not present

## 2016-03-08 MED ORDER — ONDANSETRON 4 MG PO TBDP: 4.0000 mg | ORAL_TABLET | Freq: Three times a day (TID) | ORAL | 0 refills | Status: DC | PRN

## 2016-03-08 MED ORDER — DEXAMETHASONE SODIUM PHOSPHATE 10 MG/ML IJ SOLN
10.0000 mg | Freq: Once | INTRAMUSCULAR | Status: AC
  Administered 2016-03-08: 10 mg via INTRAVENOUS

## 2016-03-08 MED ORDER — DEXAMETHASONE SODIUM PHOSPHATE 10 MG/ML IJ SOLN
INTRAMUSCULAR | Status: AC
  Administered 2016-03-08: 10 mg via INTRAVENOUS
  Filled 2016-03-08: qty 1

## 2016-03-08 MED ORDER — ONDANSETRON HCL 4 MG/2ML IJ SOLN
4.0000 mg | Freq: Once | INTRAMUSCULAR | Status: AC
  Administered 2016-03-08: 4 mg via INTRAVENOUS

## 2016-03-08 MED ORDER — SODIUM CHLORIDE 0.9 % IV BOLUS (SEPSIS)
1000.0000 mL | Freq: Once | INTRAVENOUS | Status: AC
  Administered 2016-03-08: 1000 mL via INTRAVENOUS

## 2016-03-08 MED ORDER — MAGIC MOUTHWASH: 5.0000 mL | Freq: Four times a day (QID) | ORAL | 0 refills | Status: DC | PRN

## 2016-03-08 MED ORDER — ONDANSETRON HCL 4 MG/2ML IJ SOLN
INTRAMUSCULAR | Status: AC
  Administered 2016-03-08: 4 mg via INTRAVENOUS
  Filled 2016-03-08: qty 2

## 2016-03-08 NOTE — ED Notes (Signed)

## 2016-03-08 NOTE — ED Triage Notes (Signed)
Pt reports she was diagnosed with flu at urgent care Friday. C/o of continued body aches sore throat and nausea and vomiting. States her last fever was on Friday. Currently taking Tamiflu

## 2016-03-08 NOTE — ED Provider Notes (Signed)
[facility] Emergency Department Provider Note  ____________________________________________  Time seen: Approximately 8:22 AM  I have reviewed the triage vital signs and the nursing notes.   HISTORY  Chief Complaint Influenza    HPI Alexis Mccarthy is a 56 y.o. female , NAD, presents to the emergency for evaluation of continued flulike symptoms. Patient states she was diagnosed with influenza on Friday and started on Tamiflu. States she has not had any fever since that time but she continues with body aches, sore throat and decreased appetite. States she has had nausea and vomiting in which she has not been able to keep any liquids or food down since Friday. Has had no abdominal pain or diarrhea. Denies any changes in urinary patterns. Has had no chest pain, shortness of breath, wheezing. Denies sinus pressure or ear pain. Has been taking Tamiflu and over-the-counter Tylenol and ibuprofen as needed.   Past Medical History:  Diagnosis Date  . Anxiety   . Hypercholesterolemia   . Hypertension   . Migraine   . Sarcoidosis of lung ([place])     There are no active problems to display for this patient.   Past Surgical History:  Procedure Laterality Date  . ABDOMINAL HYSTERECTOMY    . CHOLECYSTECTOMY    . LUNG BIOPSY      Prior to Admission medications   Medication Sig Start Date End Date Taking? Authorizing Provider  magic mouthwash SOLN Take 5 mLs by mouth 4 (four) times daily as needed for mouth pain. Please mix 56mL of each ingredient totaling 179mL 03/08/16   Chrislyn Seedorf L Chaske Paskett, PA-C  ondansetron (ZOFRAN ODT) 4 MG disintegrating tablet Take 1 tablet (4 mg total) by mouth every 8 (eight) hours as needed for nausea or vomiting. 03/08/16   Lasean Gorniak L Aquan Kope, PA-C    Allergies Patient has no known allergies.  Family History  Problem Relation Age of Onset  . Breast cancer Maternal Aunt     Social History Social History  Substance Use Topics  . Smoking  status: Never Smoker  . Smokeless tobacco: Never Used  . Alcohol use No     Review of Systems  Constitutional: Positive fatigue, decreased appetite. No fever/chills Eyes: No visual changes.  ENT: Positive sore throat, nasal congestion. No sinus pressure, ear pain. Cardiovascular: No chest pain. Respiratory: No cough. No shortness of breath. No wheezing.  Gastrointestinal: Positive nausea, vomiting. No abdominal pain. No diarrhea.  No constipation. Genitourinary: Negative for dysuria, hematuria. No urinary hesitancy, urgency or increased frequency. Musculoskeletal: Positive for general myalgias.  Skin: Negative for rash. Neurological: Negative for headaches, focal weakness or numbness. No tingling. 10-point ROS otherwise negative.  ____________________________________________   PHYSICAL EXAM:  VITAL SIGNS: ED Triage Vitals  Enc Vitals Group     BP 03/08/16 0803 120/80     Pulse Rate 03/08/16 0803 (!) 109     Resp 03/08/16 0803 20     Temp 03/08/16 0803 98.9 F (37.2 C)     Temp Source 03/08/16 0803 Oral     SpO2 03/08/16 0803 97 %     Weight 03/08/16 0803 170 lb (77.1 kg)     Height 03/08/16 0803 5\' 6"  (1.676 m)     Head Circumference --      Peak Flow --      Pain Score 03/08/16 0806 9     Pain Loc --      Pain Edu? --      Excl. in [place]? --  Constitutional: Alert and oriented. Ill appearing but in no acute distress. Eyes: Conjunctivae are normal without icterus, injection or discharge. PERRL. EOMI without pain.  Head: Atraumatic. ENT:      Ears: TMs visualized bilaterally without erythema, effusion, bulging or perforation.      Nose: No congestion/rhinnorhea.      Mouth/Throat: Lips appear dry but mucus membranes are moist. Pharynx with mild erythema and multiple aphthous ulcers. No exudate or swelling. Uvula is midline. Airway is patent. Clear postnasal drainage. Neck: No stridor. Supple with full range of motion. Hematological/Lymphatic/Immunilogical: No  cervical lymphadenopathy. Cardiovascular: Normal rate, regular rhythm. Normal S1 and S2.  Good peripheral circulation. Respiratory: Normal respiratory effort without tachypnea or retractions. Lungs CTAB with breath sounds noted in all lung fields. No wheeze, rhonchi, rales. Gastrointestinal: Soft and nontender without distention or guarding in all quadrants. No rebound or rigidity. Bowel sounds grossly normal active in all quadrants. Musculoskeletal: Full range of motion of bilateral upper and lower extremities without pain or difficulty. Neurologic:  Normal speech and language. No gross focal neurologic deficits are appreciated.  Skin:  Skin is warm, dry and intact. No rash noted. Psychiatric: Mood and affect are normal. Speech and behavior are normal. Patient exhibits appropriate insight and judgement.   ____________________________________________   LABS (all labs ordered are listed, but only abnormal results are displayed)  Labs Reviewed  CULTURE, GROUP A STREP [facility])   Rapid strep was negative. ____________________________________________  EKG  None ____________________________________________  RADIOLOGY  None ____________________________________________    PROCEDURES  Procedure(s) performed: None   Procedures   Medications  sodium chloride 0.9 % bolus 1,000 mL (0 mLs Intravenous Stopped 03/08/16 1002)  ondansetron (ZOFRAN) injection 4 mg (4 mg Intravenous Given 03/08/16 0841)  dexamethasone (DECADRON) injection 10 mg (10 mg Intravenous Given 03/08/16 0841)     ____________________________________________   INITIAL IMPRESSION / ASSESSMENT AND PLAN / ED COURSE  Pertinent labs & imaging results that were available during my care of the patient were reviewed by me and considered in my medical decision making (see chart for details).  Clinical Course as of Mar 08 1024  Sun Mar 08, 2016  [id number] IV saline infusion has completed. Patient looks significantly improved and  states that her nausea has significantly declined. Patient has had no episodes of emesis while in the emergency department. She has been able to eat ice chips without nausea or vomiting. She continues to have mild throat pain that seems to have improved some with Decadron.  [JH]    Clinical Course User Index [JH] Salma Walrond L Javoni Lucken, PA-C    Patient's diagnosis is consistent with aphthous ulcers, Viral pharyngitis, non intractable vomiting with nausea due to influenza. Patient will be discharged home with prescriptions for Magic mouthwash and Zofran ODT to take as directed. Patient advised to continue Tamiflu as previously prescribed. Should also continue to alternate Tylenol or ibuprofen as he for pain or fever. Patient is to follow up with her primary care provider or [facility] if symptoms persist past this treatment course. Patient is given ED precautions to return to the ED for any worsening or new symptoms.    ____________________________________________  FINAL CLINICAL IMPRESSION(S) / ED DIAGNOSES  Final diagnoses:  Aphthous ulcer  Non-intractable vomiting with nausea, unspecified vomiting type  Influenza  Viral pharyngitis      NEW MEDICATIONS STARTED DURING THIS VISIT:  Discharge Medication List as of 03/08/2016 10:14 AM    START taking these medications   Details  magic  mouthwash SOLN Take 5 mLs by mouth 4 (four) times daily as needed for mouth pain. Please mix 36mL of each ingredient totaling 136mL, Starting Sun 03/08/2016, Print    ondansetron (ZOFRAN ODT) 4 MG disintegrating tablet Take 1 tablet (4 mg total) by mouth every 8 (eight) hours as needed for nausea or vomiting., Starting Sun 03/08/2016, Print             Braxton Feathers, PA-C 03/08/16 1027    Delman Kitten, MD 03/08/16 [phone]

## 2016-03-10 LAB — CULTURE, GROUP A STREP (THRC)

## 2016-03-11 NOTE — Progress Notes (Signed)
PHARMACY CONSULT NOTE - INITIAL   Pharmacy Consult for ED CULTURE RESULTS  No Known Allergies  Patient Measurements: Height: 5\' 6"  (167.6 cm) Weight: 170 lb (77.1 kg) IBW/kg (Calculated) : 59.3  Microbiology: Recent Results (from the past 720 hour(s))  Culture, group A strep     Status: None   Collection Time: 03/08/16 10:01 AM  Result Value Ref Range Status   Specimen Description THROAT  Final   Special Requests NONE  Final   Culture MODERATE GROUP A STREP (S.PYOGENES) ISOLATED  Final   Report Status 03/10/2016 FINAL  Final    Assessment: 56 yo female seen in ED on 03/09/15 and diagnosed with viral pharyngitis.  Culture results now showing Moderate Group A Strep growth.   Plan:  After discussion with Dr. Cinda Quest, patient will be prescribed amoxicillin 500mg  TID x 7 days. Patient was contacted and culture results and Abx plan were discussed. Patient verbalized understanding. [facility] [place] per patient's request At 1415 on 2/7.   Pernell Dupre, PharmD, BCPS Clinical Pharmacist 03/11/2016 2:17 PM

## 2016-04-07 ENCOUNTER — Other Ambulatory Visit: Payer: Self-pay | Admitting: Nurse Practitioner

## 2016-04-07 DIAGNOSIS — Z1231 Encounter for screening mammogram for malignant neoplasm of breast: Secondary | ICD-10-CM

## 2016-04-14 ENCOUNTER — Encounter: Payer: Self-pay | Admitting: *Deleted

## 2016-04-15 ENCOUNTER — Encounter: Payer: Self-pay | Admitting: *Deleted

## 2016-04-15 ENCOUNTER — Ambulatory Visit: Payer: BC Managed Care – PPO | Admitting: Anesthesiology

## 2016-04-15 ENCOUNTER — Ambulatory Visit
Admission: RE | Admit: 2016-04-15 | Discharge: 2016-04-15 | Disposition: A | Discharge status: Home / Self Care | Payer: BC Managed Care – PPO | Source: Ambulatory Visit | Attending: Unknown Physician Specialty | Admitting: Unknown Physician Specialty

## 2016-04-15 ENCOUNTER — Encounter
Admission: RE | Disposition: A | Discharge status: Home / Self Care | Payer: Self-pay | Source: Ambulatory Visit | Attending: Unknown Physician Specialty

## 2016-04-15 DIAGNOSIS — I10 Essential (primary) hypertension: Secondary | ICD-10-CM | POA: Diagnosis not present

## 2016-04-15 DIAGNOSIS — M1990 Unspecified osteoarthritis, unspecified site: Secondary | ICD-10-CM | POA: Diagnosis not present

## 2016-04-15 DIAGNOSIS — D869 Sarcoidosis, unspecified: Secondary | ICD-10-CM | POA: Insufficient documentation

## 2016-04-15 DIAGNOSIS — K317 Polyp of stomach and duodenum: Secondary | ICD-10-CM | POA: Diagnosis not present

## 2016-04-15 DIAGNOSIS — F419 Anxiety disorder, unspecified: Secondary | ICD-10-CM | POA: Diagnosis not present

## 2016-04-15 DIAGNOSIS — K219 Gastro-esophageal reflux disease without esophagitis: Secondary | ICD-10-CM | POA: Diagnosis not present

## 2016-04-15 DIAGNOSIS — E7800 Pure hypercholesterolemia, unspecified: Secondary | ICD-10-CM | POA: Insufficient documentation

## 2016-04-15 DIAGNOSIS — K640 First degree hemorrhoids: Secondary | ICD-10-CM | POA: Insufficient documentation

## 2016-04-15 DIAGNOSIS — Z86010 Personal history of colonic polyps: Secondary | ICD-10-CM | POA: Diagnosis not present

## 2016-04-15 DIAGNOSIS — K2970 Gastritis, unspecified, without bleeding: Secondary | ICD-10-CM | POA: Diagnosis not present

## 2016-04-15 DIAGNOSIS — Z1211 Encounter for screening for malignant neoplasm of colon: Secondary | ICD-10-CM | POA: Insufficient documentation

## 2016-04-15 DIAGNOSIS — D122 Benign neoplasm of ascending colon: Secondary | ICD-10-CM | POA: Diagnosis not present

## 2016-04-15 DIAGNOSIS — F329 Major depressive disorder, single episode, unspecified: Secondary | ICD-10-CM | POA: Diagnosis not present

## 2016-04-15 DIAGNOSIS — K449 Diaphragmatic hernia without obstruction or gangrene: Secondary | ICD-10-CM | POA: Insufficient documentation

## 2016-04-15 DIAGNOSIS — Z9049 Acquired absence of other specified parts of digestive tract: Secondary | ICD-10-CM | POA: Diagnosis not present

## 2016-04-15 DIAGNOSIS — Z90710 Acquired absence of both cervix and uterus: Secondary | ICD-10-CM | POA: Insufficient documentation

## 2016-04-15 DIAGNOSIS — Z79899 Other long term (current) drug therapy: Secondary | ICD-10-CM | POA: Insufficient documentation

## 2016-04-15 DIAGNOSIS — Z803 Family history of malignant neoplasm of breast: Secondary | ICD-10-CM | POA: Diagnosis not present

## 2016-04-15 HISTORY — DX: Major depressive disorder, single episode, unspecified: F32.9

## 2016-04-15 HISTORY — DX: Depression, unspecified: F32.A

## 2016-04-15 HISTORY — DX: Gastro-esophageal reflux disease without esophagitis: K21.9

## 2016-04-15 HISTORY — PX: ESOPHAGOGASTRODUODENOSCOPY (EGD) WITH PROPOFOL: SHX5813

## 2016-04-15 HISTORY — PX: COLONOSCOPY WITH PROPOFOL: SHX5780

## 2016-04-15 HISTORY — DX: Unspecified osteoarthritis, unspecified site: M19.90

## 2016-04-15 SURGERY — COLONOSCOPY WITH PROPOFOL
Anesthesia: General

## 2016-04-15 MED ORDER — LIDOCAINE HCL (CARDIAC) 20 MG/ML IV SOLN
INTRAVENOUS | Status: DC | PRN
  Administered 2016-04-15: 30 mg via INTRAVENOUS

## 2016-04-15 MED ORDER — LIDOCAINE HCL (PF) 2 % IJ SOLN
INTRAMUSCULAR | Status: AC
Start: 2016-04-15 — End: 2016-04-15
  Filled 2016-04-15: qty 2

## 2016-04-15 MED ORDER — FENTANYL CITRATE (PF) 100 MCG/2ML IJ SOLN
INTRAMUSCULAR | Status: DC | PRN
  Administered 2016-04-15: 50 ug via INTRAVENOUS

## 2016-04-15 MED ORDER — PROPOFOL 500 MG/50ML IV EMUL
INTRAVENOUS | Status: AC
  Filled 2016-04-15: qty 50

## 2016-04-15 MED ORDER — FENTANYL CITRATE (PF) 100 MCG/2ML IJ SOLN
INTRAMUSCULAR | Status: AC
  Filled 2016-04-15: qty 2

## 2016-04-15 MED ORDER — PROPOFOL 500 MG/50ML IV EMUL
INTRAVENOUS | Status: DC | PRN
  Administered 2016-04-15: 120 ug/kg/min via INTRAVENOUS

## 2016-04-15 MED ORDER — MIDAZOLAM HCL 2 MG/2ML IJ SOLN
INTRAMUSCULAR | Status: DC | PRN
  Administered 2016-04-15: 2 mg via INTRAVENOUS

## 2016-04-15 MED ORDER — MIDAZOLAM HCL 2 MG/2ML IJ SOLN
INTRAMUSCULAR | Status: AC
  Filled 2016-04-15: qty 2

## 2016-04-15 MED ORDER — SODIUM CHLORIDE 0.9 % IV SOLN: INTRAVENOUS | Status: DC

## 2016-04-15 MED ORDER — SODIUM CHLORIDE 0.9 % IV SOLN
INTRAVENOUS | Status: DC
  Administered 2016-04-15: 08:00:00 via INTRAVENOUS

## 2016-04-15 NOTE — Anesthesia Preprocedure Evaluation (Signed)
Anesthesia Evaluation  Patient identified by MRN, date of birth, ID band Patient awake    Reviewed: Allergy & Precautions, NPO status , Patient's Chart, lab work & pertinent test results  History of Anesthesia Complications Negative for: history of anesthetic complications  Airway Mallampati: III       Dental   Pulmonary  Sarcoid          Cardiovascular hypertension, Pt. on medications      Neuro/Psych Anxiety Depression    GI/Hepatic Neg liver ROS, GERD  Medicated and Poorly Controlled,  Endo/Other  negative endocrine ROS  Renal/GU negative Renal ROS     Musculoskeletal   Abdominal   Peds  Hematology negative hematology ROS (+)   Anesthesia Other Findings   Reproductive/Obstetrics                            Anesthesia Physical Anesthesia Plan  ASA: II  Anesthesia Plan: General   Post-op Pain Management:    Induction: Intravenous  Airway Management Planned: Nasal Cannula  Additional Equipment:   Intra-op Plan:   Post-operative Plan:   Informed Consent: I have reviewed the patients History and Physical, chart, labs and discussed the procedure including the risks, benefits and alternatives for the proposed anesthesia with the patient or authorized representative who has indicated his/her understanding and acceptance.     Plan Discussed with:   Anesthesia Plan Comments:         Anesthesia Quick Evaluation

## 2016-04-15 NOTE — H&P (Signed)
Primary Care Physician:  Sallee Lange, NP Primary Gastroenterologist:  Dr. Vira Agar  Pre-Procedure History & Physical: HPI:  Alexis Mccarthy is a 56 y.o. female is here for an endoscopy and colonoscopy.   Past Medical History:  Diagnosis Date  . Anxiety   . Arthritis   . Depression   . GERD (gastroesophageal reflux disease)   . Hypercholesterolemia   . Hypertension   . Migraine   . Sarcoidosis of lung [facility])     Past Surgical History:  Procedure Laterality Date  . ABDOMINAL HYSTERECTOMY    . APPENDECTOMY    . CHOLECYSTECTOMY    . LUNG BIOPSY    . TONSILLECTOMY      Prior to Admission medications   Medication Sig Start Date End Date Taking? Authorizing Provider  DULoxetine (CYMBALTA) 60 MG capsule Take 60 mg by mouth daily.   Yes Historical Provider, MD  estrogens, conjugated, (PREMARIN) 0.625 MG tablet Take 0.625 mg by mouth daily. Take daily for 21 days then do not take for 7 days.   Yes Historical Provider, MD  hydrochlorothiazide (HYDRODIURIL) 12.5 MG tablet Take 12.5 mg by mouth daily. Take 2 tablets by mouth daily   Yes Historical Provider, MD  lisinopril (PRINIVIL,ZESTRIL) 40 MG tablet Take 40 mg by mouth daily.   Yes Historical Provider, MD  magic mouthwash SOLN Take 5 mLs by mouth 4 (four) times daily as needed for mouth pain. Please mix 55mL of each ingredient totaling 111mL 03/08/16  Yes Jami L Hagler, PA-C  meloxicam (MOBIC) 7.5 MG tablet Take 7.5 mg by mouth daily.   Yes Historical Provider, MD  ondansetron (ZOFRAN ODT) 4 MG disintegrating tablet Take 1 tablet (4 mg total) by mouth every 8 (eight) hours as needed for nausea or vomiting. 03/08/16  Yes Jami L Hagler, PA-C  pantoprazole (PROTONIX) 40 MG tablet Take 40 mg by mouth 2 (two) times daily.   Yes Historical Provider, MD  potassium chloride (K-DUR,KLOR-CON) 10 MEQ tablet Take 10 mEq by mouth daily.   Yes Historical Provider, MD  ranitidine (ZANTAC) 150 MG capsule Take 150 mg by mouth every evening.    Yes Historical Provider, MD  simvastatin (ZOCOR) 10 MG tablet Take 10 mg by mouth daily.   Yes Historical Provider, MD  SUMAtriptan (IMITREX) 100 MG tablet Take 100 mg by mouth every 2 (two) hours as needed for migraine. May repeat in 2 hours if headache persists or recurs.   Yes Historical Provider, MD  valACYclovir (VALTREX) 500 MG tablet Take 500 mg by mouth daily as needed.   Yes Historical Provider, MD    Allergies as of 03/09/2016  . (No Known Allergies)    Family History  Problem Relation Age of Onset  . Breast cancer Maternal Aunt     Social History   Social History  . Marital status: Married    Spouse name: N/A  . Number of children: N/A  . Years of education: N/A   Occupational History  . Not on file.   Social History Main Topics  . Smoking status: Never Smoker  . Smokeless tobacco: Never Used  . Alcohol use No  . Drug use: No  . Sexual activity: Yes    Birth control/ protection: Surgical   Other Topics Concern  . Not on file   Social History Narrative  . No narrative on file    Review of Systems: See HPI, otherwise negative ROS  Physical Exam: BP (!) 153/95   Pulse 81  Temp (!) 96.4 F (35.8 C) (Tympanic)   Resp 16   Ht 5\' 4"  (1.626 m)   Wt 75.8 kg (167 lb)   SpO2 97%   BMI 28.67 kg/m  General:   Alert,  pleasant and cooperative in NAD Head:  Normocephalic and atraumatic. Neck:  Supple; no masses or thyromegaly. Lungs:  Clear throughout to auscultation.    Heart:  Regular rate and rhythm. Abdomen:  Soft, nontender and nondistended. Normal bowel sounds, without guarding, and without rebound.   Neurologic:  Alert and  oriented x4;  grossly normal neurologically.  Impression/Plan: Alexis Mccarthy is here for an endoscopy and colonoscopy to be performed for [facility] colon polyps and GERD  Risks, benefits, limitations, and alternatives regarding  endoscopy and colonoscopy have been reviewed with the patient.  Questions have been answered.  All  parties agreeable.   Gaylyn Cheers, MD  04/15/2016, 8:45 AM

## 2016-04-15 NOTE — Op Note (Signed)
[facility] Gastroenterology Patient Name: Alexis Mccarthy Procedure Date: 04/15/2016 8:39 AM MRN: [id number] Account #: [id number] Date of Birth: Mar 01, 1960 Admit Type: Outpatient Age: 56 Room: [facility] ENDO ROOM 4 Gender: Female Note Status: Finalized Procedure:            Colonoscopy Indications:          High risk colon cancer surveillance: Personal history                        of colonic polyps Providers:            Manya Silvas, MD Referring MD:         Juluis Rainier (Referring MD) Medicines:            Propofol per Anesthesia Complications:        No immediate complications. Procedure:            Pre-Anesthesia Assessment:                       - After reviewing the risks and benefits, the patient                        was deemed in satisfactory condition to undergo the                        procedure.                       After obtaining informed consent, the colonoscope was                        passed under direct vision. Throughout the procedure,                        the patient's blood pressure, pulse, and oxygen                        saturations were monitored continuously. The                        Colonoscope was introduced through the anus and                        advanced to the the cecum, identified by appendiceal                        orifice and ileocecal valve. The colonoscopy was                        performed without difficulty. The patient tolerated the                        procedure well. The quality of the bowel preparation                        was good. Findings:      A small polyp was found in the proximal ascending colon. The polyp was       sessile. The polyp was removed with a hot snare. Resection and retrieval       were complete. Prevent delayed bleeding, one hemostatic clip was  successfully placed. There was no bleeding at the end of the procedure.      Internal hemorrhoids were found during endoscopy.  The hemorrhoids were       small and Grade I (internal hemorrhoids that do not prolapse).      The exam was otherwise without abnormality. Impression:           - One small polyp in the proximal ascending colon,                        removed with a hot snare. Resected and retrieved. Clip                        was placed.                       - Internal hemorrhoids.                       - The examination was otherwise normal. Recommendation:       - Await pathology results. Manya Silvas, MD 04/15/2016 9:30:24 AM This report has been signed electronically. Number of Addenda: 0 Note Initiated On: 04/15/2016 8:39 AM Scope Withdrawal Time: 0 hours 13 minutes 27 seconds  Total Procedure Duration: 0 hours 21 minutes 30 seconds       [facility]

## 2016-04-15 NOTE — Op Note (Signed)
[facility] Gastroenterology Patient Name: Alexis Mccarthy Procedure Date: 04/15/2016 8:40 AM MRN: [id number] Account #: [id number] Date of Birth: 21-May-1960 Admit Type: Outpatient Age: 56 Room: [facility] ENDO ROOM 4 Gender: Female Note Status: Finalized Procedure:            Upper GI endoscopy Indications:          Heartburn Providers:            Manya Silvas, MD Referring MD:         Juluis Rainier (Referring MD) Medicines:            Propofol per Anesthesia Complications:        No immediate complications. Procedure:            Pre-Anesthesia Assessment:                       - After reviewing the risks and benefits, the patient                        was deemed in satisfactory condition to undergo the                        procedure.                       After obtaining informed consent, the endoscope was                        passed under direct vision. Throughout the procedure,                        the patient's blood pressure, pulse, and oxygen                        saturations were monitored continuously. The                        Colonoscope was introduced through the mouth, and                        advanced to the second part of duodenum. The upper GI                        endoscopy was accomplished without difficulty. The                        patient tolerated the procedure well. Findings:      Localized mild erythema was found at the gastroesophageal junction. GEJ       36-37cm. Questionable small hiatal hernia.      Multiple medium pedunculated and sessile polyps with no bleeding and no       stigmata of recent bleeding were found in the gastric body. Biopsies       were taken with a cold forceps for histology.      Patchy mildly erythematous mucosa with minimal irritation but without       bleeding was found in the gastric antrum. Biopsies were taken with a       cold forceps for histology. Biopsies were taken with a cold forceps for     Helicobacter pylori testing.      The examined duodenum was normal. Impression:           -  Erythema at the gastroesophageal junction.                       - Multiple gastric polyps. Biopsied.                       - Erythematous mucosa in the antrum. Biopsied.                       - Normal examined duodenum. Recommendation:       - Await pathology results.                       - Perform a colonoscopy as previously scheduled. Manya Silvas, MD 04/15/2016 8:59:40 AM This report has been signed electronically. Number of Addenda: 0 Note Initiated On: 04/15/2016 8:40 AM      [facility]

## 2016-04-15 NOTE — Anesthesia Procedure Notes (Signed)
Performed by: COOK-MARTIN, Angelos Wasco Pre-anesthesia Checklist: Patient identified, Emergency Drugs available, Suction available, Patient being monitored and Timeout performed Patient Re-evaluated:Patient Re-evaluated prior to inductionOxygen Delivery Method: Nasal cannula Preoxygenation: Pre-oxygenation with 100% oxygen Intubation Type: IV induction Airway Equipment and Method: Bite block Placement Confirmation: CO2 detector and positive ETCO2     

## 2016-04-15 NOTE — Transfer of Care (Signed)
Immediate Anesthesia Transfer of Care Note  Patient: Alexis Mccarthy  Procedure(s) Performed: Procedure(s): COLONOSCOPY WITH PROPOFOL (N/A) ESOPHAGOGASTRODUODENOSCOPY (EGD) WITH PROPOFOL (N/A)  Patient Location: PACU  Anesthesia Type:General  Level of Consciousness: awake and sedated  Airway & Oxygen Therapy: Patient Spontanous Breathing and Patient connected to nasal cannula oxygen  Post-op Assessment: Report given to RN and Post -op Vital signs reviewed and stable  Post vital signs: Reviewed and stable  Last Vitals:  Vitals:   04/15/16 0752  BP: (!) 153/95  Pulse: 81  Resp: 16  Temp: (!) 35.8 C    Last Pain:  Vitals:   04/15/16 0752  TempSrc: Tympanic         Complications: No apparent anesthesia complications

## 2016-04-15 NOTE — Anesthesia Postprocedure Evaluation (Signed)
Anesthesia Post Note  Patient: Alexis Mccarthy  Procedure(s) Performed: Procedure(s) (LRB): COLONOSCOPY WITH PROPOFOL (N/A) ESOPHAGOGASTRODUODENOSCOPY (EGD) WITH PROPOFOL (N/A)  Patient location during evaluation: Endoscopy Anesthesia Type: General Level of consciousness: awake and alert Pain management: pain level controlled Vital Signs Assessment: post-procedure vital signs reviewed and stable Respiratory status: spontaneous breathing and respiratory function stable Cardiovascular status: stable Anesthetic complications: no     Last Vitals:  Vitals:   04/15/16 0752 04/15/16 0929  BP: (!) 153/95 (P) 116/75  Pulse: 81   Resp: 16   Temp: (!) 35.8 C (P) 36.2 C    Last Pain:  Vitals:   04/15/16 0929  TempSrc: (P) Tympanic                 KEPHART,WILLIAM K

## 2016-04-15 NOTE — Anesthesia Post-op Follow-up Note (Cosign Needed)
Anesthesia QCDR form completed.        

## 2016-04-16 ENCOUNTER — Encounter: Payer: Self-pay | Admitting: Unknown Physician Specialty

## 2016-04-16 ENCOUNTER — Ambulatory Visit
Admission: RE | Admit: 2016-04-16 | Discharge: 2016-04-16 | Disposition: A | Discharge status: Home / Self Care | Payer: BC Managed Care – PPO | Source: Ambulatory Visit | Attending: Nurse Practitioner | Admitting: Nurse Practitioner

## 2016-04-16 ENCOUNTER — Other Ambulatory Visit: Payer: Self-pay | Admitting: Nurse Practitioner

## 2016-04-16 DIAGNOSIS — Z1231 Encounter for screening mammogram for malignant neoplasm of breast: Secondary | ICD-10-CM | POA: Diagnosis present

## 2016-04-16 LAB — SURGICAL PATHOLOGY

## 2017-04-07 ENCOUNTER — Other Ambulatory Visit: Payer: Self-pay | Admitting: Nurse Practitioner

## 2017-04-07 DIAGNOSIS — Z1231 Encounter for screening mammogram for malignant neoplasm of breast: Secondary | ICD-10-CM

## 2017-04-08 ENCOUNTER — Inpatient Hospital Stay: Admission: RE | Admit: 2017-04-08 | Payer: BC Managed Care – PPO | Source: Ambulatory Visit

## 2017-04-08 ENCOUNTER — Other Ambulatory Visit: Payer: Self-pay | Admitting: Nurse Practitioner

## 2017-04-08 DIAGNOSIS — E663 Overweight: Secondary | ICD-10-CM

## 2017-04-08 DIAGNOSIS — E782 Mixed hyperlipidemia: Secondary | ICD-10-CM

## 2017-04-08 DIAGNOSIS — E119 Type 2 diabetes mellitus without complications: Secondary | ICD-10-CM

## 2017-04-08 DIAGNOSIS — R7989 Other specified abnormal findings of blood chemistry: Secondary | ICD-10-CM

## 2017-04-08 DIAGNOSIS — I10 Essential (primary) hypertension: Secondary | ICD-10-CM

## 2017-04-08 DIAGNOSIS — R945 Abnormal results of liver function studies: Secondary | ICD-10-CM

## 2017-04-13 ENCOUNTER — Ambulatory Visit
Admission: RE | Admit: 2017-04-13 | Discharge: 2017-04-13 | Disposition: A | Discharge status: Home / Self Care | Payer: BC Managed Care – PPO | Source: Ambulatory Visit | Attending: Nurse Practitioner | Admitting: Nurse Practitioner

## 2017-04-13 DIAGNOSIS — K760 Fatty (change of) liver, not elsewhere classified: Secondary | ICD-10-CM | POA: Insufficient documentation

## 2017-04-13 DIAGNOSIS — R945 Abnormal results of liver function studies: Secondary | ICD-10-CM | POA: Diagnosis not present

## 2017-04-13 DIAGNOSIS — E782 Mixed hyperlipidemia: Secondary | ICD-10-CM | POA: Diagnosis present

## 2017-04-13 DIAGNOSIS — N281 Cyst of kidney, acquired: Secondary | ICD-10-CM | POA: Insufficient documentation

## 2017-04-13 DIAGNOSIS — E663 Overweight: Secondary | ICD-10-CM

## 2017-04-13 DIAGNOSIS — I10 Essential (primary) hypertension: Secondary | ICD-10-CM

## 2017-04-13 DIAGNOSIS — E119 Type 2 diabetes mellitus without complications: Secondary | ICD-10-CM

## 2017-04-13 DIAGNOSIS — R7989 Other specified abnormal findings of blood chemistry: Secondary | ICD-10-CM

## 2017-04-19 ENCOUNTER — Ambulatory Visit
Admission: RE | Admit: 2017-04-19 | Discharge: 2017-04-19 | Disposition: A | Discharge status: Home / Self Care | Payer: BC Managed Care – PPO | Source: Ambulatory Visit | Attending: Nurse Practitioner | Admitting: Nurse Practitioner

## 2017-04-19 DIAGNOSIS — Z1231 Encounter for screening mammogram for malignant neoplasm of breast: Secondary | ICD-10-CM | POA: Insufficient documentation

## 2018-03-08 ENCOUNTER — Ambulatory Visit: Payer: Self-pay | Admitting: Family Medicine

## 2018-03-16 ENCOUNTER — Ambulatory Visit: Payer: Self-pay | Admitting: Family Medicine

## 2018-04-26 ENCOUNTER — Other Ambulatory Visit: Payer: Self-pay

## 2018-04-26 ENCOUNTER — Encounter: Payer: Self-pay | Admitting: Family Medicine

## 2018-04-26 ENCOUNTER — Ambulatory Visit: Payer: Self-pay | Admitting: Family Medicine

## 2018-04-26 VITALS — BP 160/84 | HR 93 | Temp 98.1°F | Resp 16 | Ht 66.0 in | Wt 167.9 lb

## 2018-04-26 DIAGNOSIS — F32A Depression, unspecified: Secondary | ICD-10-CM | POA: Insufficient documentation

## 2018-04-26 DIAGNOSIS — Z114 Encounter for screening for human immunodeficiency virus [HIV]: Secondary | ICD-10-CM

## 2018-04-26 DIAGNOSIS — E1165 Type 2 diabetes mellitus with hyperglycemia: Secondary | ICD-10-CM

## 2018-04-26 DIAGNOSIS — K760 Fatty (change of) liver, not elsewhere classified: Secondary | ICD-10-CM

## 2018-04-26 DIAGNOSIS — G43909 Migraine, unspecified, not intractable, without status migrainosus: Secondary | ICD-10-CM | POA: Insufficient documentation

## 2018-04-26 DIAGNOSIS — J309 Allergic rhinitis, unspecified: Secondary | ICD-10-CM | POA: Insufficient documentation

## 2018-04-26 DIAGNOSIS — F419 Anxiety disorder, unspecified: Secondary | ICD-10-CM

## 2018-04-26 DIAGNOSIS — H9313 Tinnitus, bilateral: Secondary | ICD-10-CM | POA: Insufficient documentation

## 2018-04-26 DIAGNOSIS — E785 Hyperlipidemia, unspecified: Secondary | ICD-10-CM | POA: Insufficient documentation

## 2018-04-26 DIAGNOSIS — K219 Gastro-esophageal reflux disease without esophagitis: Secondary | ICD-10-CM | POA: Insufficient documentation

## 2018-04-26 DIAGNOSIS — R0683 Snoring: Secondary | ICD-10-CM | POA: Insufficient documentation

## 2018-04-26 DIAGNOSIS — F329 Major depressive disorder, single episode, unspecified: Secondary | ICD-10-CM

## 2018-04-26 DIAGNOSIS — Z1231 Encounter for screening mammogram for malignant neoplasm of breast: Secondary | ICD-10-CM

## 2018-04-26 DIAGNOSIS — E663 Overweight: Secondary | ICD-10-CM

## 2018-04-26 DIAGNOSIS — R102 Pelvic and perineal pain: Secondary | ICD-10-CM | POA: Insufficient documentation

## 2018-04-26 DIAGNOSIS — D860 Sarcoidosis of lung: Secondary | ICD-10-CM

## 2018-04-26 DIAGNOSIS — Z1159 Encounter for screening for other viral diseases: Secondary | ICD-10-CM

## 2018-04-26 DIAGNOSIS — M1990 Unspecified osteoarthritis, unspecified site: Secondary | ICD-10-CM | POA: Insufficient documentation

## 2018-04-26 DIAGNOSIS — I10 Essential (primary) hypertension: Secondary | ICD-10-CM | POA: Insufficient documentation

## 2018-04-26 MED ORDER — LISINOPRIL 40 MG PO TABS: 40.0000 mg | ORAL_TABLET | Freq: Every day | ORAL | 1 refills | Status: DC

## 2018-04-26 MED ORDER — VALACYCLOVIR HCL 500 MG PO TABS: 500.0000 mg | ORAL_TABLET | Freq: Every day | ORAL | 1 refills | Status: DC | PRN

## 2018-04-26 MED ORDER — HYDROCHLOROTHIAZIDE 12.5 MG PO TABS: 12.5000 mg | ORAL_TABLET | Freq: Every day | ORAL | 1 refills | Status: DC

## 2018-04-26 MED ORDER — MELOXICAM 7.5 MG PO TABS: 7.5000 mg | ORAL_TABLET | Freq: Every day | ORAL | 1 refills | Status: DC | PRN

## 2018-04-26 MED ORDER — BUSPIRONE HCL 7.5 MG PO TABS: ORAL_TABLET | ORAL | 1 refills | Status: DC

## 2018-04-26 MED ORDER — CITALOPRAM HYDROBROMIDE 20 MG PO TABS: 20.0000 mg | ORAL_TABLET | Freq: Every day | ORAL | 1 refills | Status: DC

## 2018-04-26 NOTE — Patient Instructions (Addendum)
Call your medical insurance and find out if diabetic eye exam is covered.   Message or call our office to report back on home blood pressure readings over the next 2 weeks.  To help with reflux: - Elevate your head of bed - either with a few extra pillows, a wedge pillow, or by placing two bed risers under the head of bed posts. - Avoid the following foods: citrus, fatty foods, chocolate, peppermint, and excessive alcohol, along with sodas, orange juice (acidic drinks) - Stop eating at least 3 hours before going to bed, minimize naps/laying down after eating. - No smoking. - If you are overweight or obese, exercising and losing weight will also help your symptoms. - Caution: prolonged use of proton pump inhibitors like omeprazole (Prilosec), pantoprazole (Protonix), esomeprazole (Nexium), and others like Dexilant and Aciphex may increase your risk of pneumonia, Clostridium difficile colitis, osteoporosis, anemia and other health complications

## 2018-04-26 NOTE — Assessment & Plan Note (Signed)
Add buspar, wants to remain on Celexa for the time being.

## 2018-04-26 NOTE — Assessment & Plan Note (Signed)
Has some mild DOE, does not want to see pulmonology at this time.

## 2018-04-26 NOTE — Progress Notes (Signed)
Name: Alexis Mccarthy   MRN: [id number]    DOB: March 07, 1960   Date:04/26/2018       Progress Note  Subjective  Chief Complaint  Chief Complaint  Patient presents with  . Establish Care  . Hypertension    medication refills  . Hyperlipidemia  . Gastroesophageal Reflux    HPI  HTN:  -does take medications as prescribed - current regimen includes Lisinopril 40mg  and HCTZ 12.5mg  - has been out for about 1 week. - Does have BP cuff at home and is able to check at home - has been elevated over the last week, but before that it was 120-130's/60's.  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 She is not following any particular diet and finds herself overeating at times.  Hyperlipidemia: Current Medication Regimen: Last Lipids: No results found for: CHOL, HDL, LDLCALC, LDLDIRECT, TRIG, CHOLHDL - Current Diet: - Denies: Chest pain, shortness of breath, myalgias. - Documented aortic atherosclerosis? No - Risk factors for atherosclerosis: diabetes mellitus, hypercholesterolemia and hypertension  Sarcoidosis: Was seeing Dr. Lauris Chroman with pulmonology - last visit in 2017, has not followed up since then.  Does have some mild DOE.  She was Rx'd Qvar but never used it.  Uses albuterol PRN.   GERD: - Current medication regimen: Was on Zantac, but was recalled.  Now taking protonix twice daily and this seems to work well for her. She  - Possible Triggers: She has never considered triggers - will begin to try to note possible triggers. - Endorses: Burning in her throat. - Denies: weight loss, dysphagia, black stools or history of GI bleeding  Diabetes  mellitus type 2 Checking sugars?  no How often? N/A Range (low to high) over last two weeks:  N/A Does patient feel additional teaching/training would be helpful?  no  Have they attended Diabetes education classes? no  Trying to limit white bread, white rice, white potatoes, sweets?  no Trying to limit sweetened drinks like iced tea, soft drinks, sports drinks, fruit juices?  no - drinks more water than anything, but sometimes drinks sodas or sweet tea. Checking feet every day/night?  no - discussed in detail Last eye exam: Had with [facility].  Denies: Polyuria, polydipsia, polyphagia, vision changes, or neuropathy.  Most recent A1C: August 2019  - 7.2% We will recheck today. Last CMP Results : is due for repeat today    Component Value Date/Time   NA 139 04/06/2015 1316   NA 137 03/27/2013 2314   K 3.2 (L) 04/06/2015 1316   K 3.5 03/27/2013 2314   CL 103 04/06/2015 1316   CL 102 03/27/2013 2314   CO2 28 04/06/2015 1316   CO2 31 03/27/2013 2314   GLUCOSE 150 (H) 04/06/2015 1316   GLUCOSE 123 (H) 03/27/2013 2314   BUN 15 04/06/2015 1316   BUN 12 03/27/2013 2314   CREATININE 0.67 04/06/2015 1316   CREATININE 0.94 03/27/2013 2314   CALCIUM 9.0 04/06/2015 1316   CALCIUM 8.8 03/27/2013 2314   PROT 7.4 04/06/2015 1316   PROT 8.1 10/14/2012 1407   ALBUMIN 4.2 04/06/2015 1316   ALBUMIN 4.2 10/14/2012 1407  AST 28 04/06/2015 1316   AST 26 10/14/2012 1407   ALT 24 04/06/2015 1316   ALT 31 10/14/2012 1407   ALKPHOS 61 04/06/2015 1316   ALKPHOS 72 10/14/2012 1407   BILITOT 0.3 04/06/2015 1316   BILITOT 0.4 10/14/2012 1407   GFRNONAA >60 04/06/2015 1316   GFRNONAA >60 03/27/2013 2314   GFRAA >60 04/06/2015 1316   GFRAA >60 03/27/2013 2314   Urine Micro UTD? No Current Medication Management: Diabetic Medications: She is prescribed metformin but only takes occasionally - no GI upset with the medications.  Has low BG on 1000mg  Metformin so is only Rx'd 500mg .  She denies  family or personal history of thyroid cancer or pancreatitis.  ACEI/ARB: Yes Statin: Rx'd, but not taking Aspirin therapy: No  Hepatic Steatosis:  Has history fatty liver, had elevated LFT's, we will check today.   Anxiety and Depression: Has been on several different options, cymbalta stopped working after taking for several years.  Was recommended to go to therapy but was not able to afford. She was put on Trazodone for sleep and it caused to much drowsiness the next day.  She feels that her anxiety is worse than the depression.  Does not have difficulty sleeping now, and denies any recent panic attacks.    [facility] Visit from 04/26/2018 in [facility]  PHQ-9 Total Score  11     Patient Active Problem List   Diagnosis Date Noted  . Allergic rhinitis 04/26/2018  . Anxiety and depression 04/26/2018  . DJD (degenerative joint disease) 04/26/2018  . GERD (gastroesophageal reflux disease) 04/26/2018  . Hyperlipidemia 04/26/2018  . Hypertension 04/26/2018  . Migraines 04/26/2018  . Pelvic pain in female 04/26/2018  . Snoring 04/26/2018  . Tinnitus, bilateral 04/26/2018  . 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 Overweight (BMI 25.0-29.9) 04/26/2018  . Hepatic steatosis 04/13/2017  . DM (diabetes mellitus), type 2 ([place]) 04/03/2015   Past Surgical History:  Procedure Laterality Date  . ABDOMINAL HYSTERECTOMY    . APPENDECTOMY    . BREAST BIOPSY Left    neg  . CHOLECYSTECTOMY    . COLONOSCOPY WITH PROPOFOL N/A 04/15/2016   Procedure: COLONOSCOPY WITH PROPOFOL;  Surgeon: Manya Silvas, MD;  Location: [facility] ENDOSCOPY;  Service: Endoscopy;  Laterality: N/A;  . ESOPHAGOGASTRODUODENOSCOPY (EGD) WITH PROPOFOL N/A 04/15/2016   Procedure: ESOPHAGOGASTRODUODENOSCOPY (EGD) WITH PROPOFOL;  Surgeon: Manya Silvas, MD;  Location: [facility] ENDOSCOPY;  Service: Endoscopy;  Laterality: N/A;  . LUNG BIOPSY    . TONSILLECTOMY      Family History  Problem Relation Age of Onset  .  Breast cancer Maternal Aunt   . Breast cancer Sister 53    Social History   Socioeconomic History  . Marital status: Married    Spouse name: Fritz Pickerel  . Number of children: 3  . Years of education: Not on file  . Highest education level: Not on file  Occupational History  . Not on file  Social Needs  . Financial resource strain: Not hard at all  . Food insecurity:    Worry: Never true    Inability: Never true  . Transportation needs:    Medical: No    Non-medical: No  Tobacco Use  . Smoking status: Never Smoker  . Smokeless tobacco: Never Used  Substance and Sexual Activity  . Alcohol use: No  . Drug use: No  . Sexual activity: Yes    Birth control/protection: Surgical  Lifestyle  . Physical activity:  Days per week: 0 days    Minutes per session: 0 min  . Stress: Only a little  Relationships  . Social connections:    Talks on phone: More than three times a week    Gets together: More than three times a week    Attends religious service: Never    Active member of club or organization: No    Attends meetings of clubs or organizations: Never    Relationship status: Married  . Intimate partner violence:    Fear of current or ex partner: No    Emotionally abused: No    Physically abused: No    Forced sexual activity: No  Other Topics Concern  . Not on file  Social History Narrative  . Not on file     Current Outpatient Medications:  .  citalopram (CELEXA) 20 MG tablet, Take 1 tablet (20 mg total) by mouth daily., Disp: 90 tablet, Rfl: 1 .  hydrochlorothiazide (HYDRODIURIL) 12.5 MG tablet, Take 1 tablet (12.5 mg total) by mouth daily. Take 2 tablets by mouth daily, Disp: 90 tablet, Rfl: 1 .  lisinopril (PRINIVIL,ZESTRIL) 40 MG tablet, Take 1 tablet (40 mg total) by mouth daily., Disp: 90 tablet, Rfl: 1 .  meloxicam (MOBIC) 7.5 MG tablet, Take 7.5 mg by mouth daily., Disp: , Rfl:  .  metFORMIN (GLUCOPHAGE) 500 MG tablet, Take 500 mg by mouth daily with breakfast.,  Disp: , Rfl:  .  pantoprazole (PROTONIX) 40 MG tablet, Take 40 mg by mouth 2 (two) times daily., Disp: , Rfl:  .  potassium chloride (K-DUR,KLOR-CON) 10 MEQ tablet, Take 10 mEq by mouth daily., Disp: , Rfl:  .  SUMAtriptan (IMITREX) 100 MG tablet, Take 100 mg by mouth every 2 (two) hours as needed for migraine. May repeat in 2 hours if headache persists or recurs., Disp: , Rfl:  .  valACYclovir (VALTREX) 500 MG tablet, Take 500 mg by mouth daily as needed., Disp: , Rfl:  .  busPIRone (BUSPAR) 7.5 MG tablet, Take 1 tablet once daily at night.  May take 1 tablet as needed for anxiety during the day as well., Disp: 90 tablet, Rfl: 1  No Known Allergies  I personally reviewed active problem list, medication list, allergies, health maintenance, notes from last encounter, lab results with the patient/caregiver today.   ROS  Constitutional: Negative for fever or weight change.  Respiratory: Negative for cough and shortness of breath.   Cardiovascular: Negative for chest pain or palpitations.  Gastrointestinal: Negative for abdominal pain, no bowel changes.  Musculoskeletal: Negative for gait problem or joint swelling.  Skin: Negative for rash.  Neurological: Negative for dizziness or headache.  No other specific complaints in a complete review of systems (except as listed in HPI above).  Objective  Vitals:   04/26/18 0958  BP: (!) 160/84  Pulse: 93  Resp: 16  Temp: 98.1 F (36.7 C)  TempSrc: Oral  SpO2: 94%  Weight: 167 lb 14.4 oz (76.2 kg)  Height: 5\' 6"  (1.676 m)   Body mass index is 27.1 kg/m.  Physical Exam  Constitutional: Patient appears well-developed and well-nourished. No distress.  HENT: Head: Normocephalic and atraumatic. Ears: bilateral TMs with no erythema or effusion; Nose: Nose normal. Mouth/Throat: Oropharynx is clear and moist. No oropharyngeal exudate or tonsillar swelling.  Eyes: Conjunctivae and EOM are normal. No scleral icterus.  Pupils are equal, round, and  reactive to light.  Neck: Normal range of motion. Neck supple. No JVD present. No thyromegaly present.  Cardiovascular: Normal rate, regular rhythm and normal heart sounds.  No murmur heard. No BLE edema. Pulmonary/Chest: Effort normal and breath sounds normal. No respiratory distress. Abdominal: Soft. Bowel sounds are normal, no distension. There is no tenderness. No masses. Musculoskeletal: Normal range of motion, no joint effusions. No gross deformities Neurological: Pt is alert and oriented to person, place, and time. No cranial nerve deficit. Coordination, balance, strength, speech and gait are normal.  Skin: Skin is warm and dry. No rash noted. No erythema.  Psychiatric: Patient has a normal mood and affect. behavior is normal. Judgment and thought content normal.   No results found for this or any previous visit (from the past 72 hour(s)).  PHQ2/9: Depression screen PHQ 2/9 04/26/2018  Decreased Interest 2  Down, Depressed, Hopeless 1  PHQ - 2 Score 3  Altered sleeping 1  Tired, decreased energy 2  Change in appetite 1  Feeling bad or failure about yourself  1  Trouble concentrating 1  Moving slowly or fidgety/restless 1  Suicidal thoughts 1  PHQ-9 Score 11  Difficult doing work/chores Somewhat difficult   PHQ-2/9 Result is positive.    Fall Risk: Fall Risk  04/26/2018  Falls in the past year? 0  Number falls in past yr: 0  Injury with Fall? 0  Follow up Falls evaluation completed   Assessment & Plan  Problem List Items Addressed This Visit      Cardiovascular and Mediastinum   Hypertension - Primary    Restart medications, due to [place] pandemic, will send home BP's in 2 weeks rather than 2 week BP follow up check.      Relevant Medications   lisinopril (PRINIVIL,ZESTRIL) 40 MG tablet   hydrochlorothiazide (HYDRODIURIL) 12.5 MG tablet   Other Relevant Orders   COMPLETE METABOLIC PANEL WITH GFR   Ambulatory referral to Chronic Care Management Services      Respiratory   Sarcoidosis of lung (HCC)    Has some mild DOE, does not want to see pulmonology at this time.        Digestive   GERD (gastroesophageal reflux disease)    Discussed risk of PPI's, wants to continue on protonix at this time      Hepatic steatosis    Will check LFT's today.  Cannot find [place] of liver in [facility] Records.      Relevant Orders   COMPLETE METABOLIC PANEL WITH GFR   Lipid panel     Endocrine   DM (diabetes mellitus), type 2 ([place])    We will update labs and adjust medications accordingly.      Relevant Medications   metFORMIN (GLUCOPHAGE) 500 MG tablet   lisinopril (PRINIVIL,ZESTRIL) 40 MG tablet   Other Relevant Orders   Ambulatory referral to Ophthalmology   Microalbumin / creatinine urine ratio   COMPLETE METABOLIC PANEL WITH GFR   Hemoglobin A1c   Ambulatory referral to Chronic Care Management Services     Other   Anxiety and depression    Add buspar, wants to remain on Celexa for the time being.      Relevant Medications   citalopram (CELEXA) 20 MG tablet   busPIRone (BUSPAR) 7.5 MG tablet   Other Relevant Orders   Ambulatory referral to Chronic Care Management Services   Hyperlipidemia    Labs and discussed decreasing fried/fatty foods      Relevant Medications   lisinopril (PRINIVIL,ZESTRIL) 40 MG tablet   hydrochlorothiazide (HYDRODIURIL) 12.5 MG tablet   Other Relevant Orders   Lipid  panel   Ambulatory referral to Chronic Care Management Services   Overweight (BMI 25.0-29.9)    Discussed importance of 150 minutes of physical activity weekly, eat two servings of fish weekly, eat one serving of tree nuts ( cashews, pistachios, pecans, almonds.Marland Kitchen) every other day, eat 6 servings of fruit/vegetables daily and drink plenty of water and avoid sweet beverages.         Other Visit Diagnoses    Encounter for screening for HIV       Relevant Orders   HIV Antibody (routine testing w rflx)   Need for hepatitis C screening test        Relevant Orders   Hepatitis C antibody   Breast cancer screening by mammogram       Relevant Orders   MM 3D SCREEN BREAST BILATERAL

## 2018-04-26 NOTE — Assessment & Plan Note (Signed)
Discussed risk of PPI's, wants to continue on protonix at this time

## 2018-04-26 NOTE — Assessment & Plan Note (Signed)
Restart medications, due to [place] pandemic, will send home BP's in 2 weeks rather than 2 week BP follow up check.

## 2018-04-26 NOTE — Assessment & Plan Note (Signed)
Labs and discussed decreasing fried/fatty foods

## 2018-04-26 NOTE — Assessment & Plan Note (Signed)
Will check LFT's today.  Cannot find [place] of liver in [facility] Records.

## 2018-04-26 NOTE — Assessment & Plan Note (Signed)
Discussed importance of 150 minutes of physical activity weekly, eat two servings of fish weekly, eat one serving of tree nuts ( cashews, pistachios, pecans, almonds..) every other day, eat 6 servings of fruit/vegetables daily and drink plenty of water and avoid sweet beverages. 

## 2018-04-26 NOTE — Assessment & Plan Note (Signed)
We will update labs and adjust medications accordingly.

## 2018-04-27 ENCOUNTER — Other Ambulatory Visit: Payer: Self-pay | Admitting: Family Medicine

## 2018-04-27 DIAGNOSIS — E1165 Type 2 diabetes mellitus with hyperglycemia: Secondary | ICD-10-CM

## 2018-04-27 DIAGNOSIS — E1169 Type 2 diabetes mellitus with other specified complication: Secondary | ICD-10-CM | POA: Insufficient documentation

## 2018-04-27 DIAGNOSIS — E785 Hyperlipidemia, unspecified: Secondary | ICD-10-CM

## 2018-04-27 LAB — COMPLETE METABOLIC PANEL WITH GFR
AG RATIO: 1.4 (calc) (ref 1.0–2.5)
ALBUMIN MSPROF: 4.3 g/dL (ref 3.6–5.1)
ALKALINE PHOSPHATASE (APISO): 58 U/L (ref 37–153)
ALT: 63 U/L — ABNORMAL HIGH (ref 6–29)
AST: 54 U/L — AB (ref 10–35)
BUN: 12 mg/dL (ref 7–25)
CALCIUM: 9.2 mg/dL (ref 8.6–10.4)
CO2: 30 mmol/L (ref 20–32)
CREATININE: 0.78 mg/dL (ref 0.50–1.05)
Chloride: 99 mmol/L (ref 98–110)
GFR, EST AFRICAN AMERICAN: 97 mL/min/{1.73_m2} (ref >=60)
GFR, EST NON AFRICAN AMERICAN: 84 mL/min/{1.73_m2} (ref >=60)
GLOBULIN: 3 g/dL (ref 1.9–3.7)
Glucose, Bld: 150 mg/dL — ABNORMAL HIGH (ref 65–99)
POTASSIUM: 3.5 mmol/L (ref 3.5–5.3)
SODIUM: 140 mmol/L (ref 135–146)
TOTAL PROTEIN: 7.3 g/dL (ref 6.1–8.1)
Total Bilirubin: 0.6 mg/dL (ref 0.2–1.2)

## 2018-04-27 LAB — LIPID PANEL
Cholesterol: 235 mg/dL — ABNORMAL HIGH (ref <200)
HDL: 49 mg/dL — AB (ref >=50)
LDL CHOLESTEROL (CALC): 155 mg/dL — AB
NON-HDL CHOLESTEROL (CALC): 186 mg/dL — AB (ref <130)
TRIGLYCERIDES: 177 mg/dL — AB (ref <150)
Total CHOL/HDL Ratio: 4.8 (calc) (ref <5.0)

## 2018-04-27 LAB — HEMOGLOBIN A1C
Hgb A1c MFr Bld: 8.2 % of total Hgb — ABNORMAL HIGH (ref <5.7)
MEAN PLASMA GLUCOSE: 189 (calc)
eAG (mmol/L): 10.4 (calc)

## 2018-04-27 LAB — MICROALBUMIN / CREATININE URINE RATIO
Creatinine, Urine: 187 mg/dL (ref 20–275)
MICROALB UR: 2.2 mg/dL
Microalb Creat Ratio: 12 mcg/mg creat (ref <30)

## 2018-04-27 LAB — HIV ANTIBODY (ROUTINE TESTING W REFLEX): HIV 1&2 Ab, 4th Generation: NONREACTIVE

## 2018-04-27 LAB — HEPATITIS C ANTIBODY
HEP C AB: NONREACTIVE
SIGNAL TO CUT-OFF: 0.05 (ref <1.00)

## 2018-04-27 MED ORDER — ROSUVASTATIN CALCIUM 10 MG PO TABS: 10.0000 mg | ORAL_TABLET | Freq: Every day | ORAL | 3 refills | Status: AC

## 2018-04-27 MED ORDER — DAPAGLIFLOZIN PROPANEDIOL 10 MG PO TABS: ORAL_TABLET | ORAL | 1 refills | Status: DC

## 2018-05-05 ENCOUNTER — Encounter: Payer: Self-pay | Admitting: Family Medicine

## 2018-05-05 DIAGNOSIS — E1165 Type 2 diabetes mellitus with hyperglycemia: Secondary | ICD-10-CM

## 2018-05-05 MED ORDER — BLOOD GLUCOSE MONITOR KIT: PACK | 0 refills | Status: AC

## 2018-05-06 ENCOUNTER — Ambulatory Visit: Payer: Self-pay

## 2018-05-06 DIAGNOSIS — I10 Essential (primary) hypertension: Secondary | ICD-10-CM

## 2018-05-06 DIAGNOSIS — E1165 Type 2 diabetes mellitus with hyperglycemia: Secondary | ICD-10-CM

## 2018-05-06 DIAGNOSIS — E785 Hyperlipidemia, unspecified: Secondary | ICD-10-CM

## 2018-05-06 DIAGNOSIS — E1169 Type 2 diabetes mellitus with other specified complication: Secondary | ICD-10-CM

## 2018-05-06 NOTE — Chronic Care Management (AMB) (Signed)
   Chronic Care Management   Unsuccessful Call Note 05/06/2018 Name: Alexis Mccarthy MRN: [id number] DOB: 01-18-1961  Alexis Mccarthy is a 58 year old female who sees [name], FNP for primary care. Alexis Mccarthy asked the CCM team to consult the patient for chronic care management and care coordination secondary to the need for DM/HTN, Glucometer and Medication education instructions. Referral was placed 04/26/2018 during patient's last office visit.   Was unable to reach patient via telephone today for introduction to CCM Services. I have left HIPAA compliant voicemail asking patient to return my call. (unsuccessful outreach #1).   Plan: Will follow-up within 7 business days via telephone.      Ludene Stokke E. Rollene Rotunda, RN, BSN Nurse Care Coordinator [facility] Care Management  [phone]

## 2018-05-17 ENCOUNTER — Ambulatory Visit: Payer: Self-pay | Admitting: Pharmacist

## 2018-05-17 DIAGNOSIS — I10 Essential (primary) hypertension: Secondary | ICD-10-CM

## 2018-05-17 DIAGNOSIS — E1165 Type 2 diabetes mellitus with hyperglycemia: Secondary | ICD-10-CM

## 2018-05-17 NOTE — Chronic Care Management (AMB) (Signed)
  Care Management   Note  05/17/2018 Name: Alexis Mccarthy MRN: [id number] DOB: [birth year]  58 y.o. year old female referred to Chronic Care Management by [name] for medication education, assistance with glucometer, HTN and DM education. Last office visit with Hubbard Hartshorn, FNP was 04/26/18.   Was able to reach patient via telephone today, but unfortunately patient was at work and unable to speak. She requested a call back another day. (unsuccessful outreach #2).  Follow up outreach will be made within the next 7 days.   Ruben Reason, PharmD Clinical Pharmacist [place] [phone]

## 2018-06-02 ENCOUNTER — Other Ambulatory Visit: Payer: Self-pay | Admitting: Family Medicine

## 2018-06-02 DIAGNOSIS — F32A Depression, unspecified: Secondary | ICD-10-CM

## 2018-06-02 DIAGNOSIS — F329 Major depressive disorder, single episode, unspecified: Secondary | ICD-10-CM

## 2018-06-02 DIAGNOSIS — F419 Anxiety disorder, unspecified: Principal | ICD-10-CM

## 2018-06-07 ENCOUNTER — Ambulatory Visit: Payer: Self-pay

## 2018-06-07 DIAGNOSIS — I10 Essential (primary) hypertension: Secondary | ICD-10-CM

## 2018-06-07 DIAGNOSIS — E1165 Type 2 diabetes mellitus with hyperglycemia: Secondary | ICD-10-CM

## 2018-06-07 DIAGNOSIS — D860 Sarcoidosis of lung: Secondary | ICD-10-CM

## 2018-06-07 NOTE — Chronic Care Management (AMB) (Signed)
   Chronic Care Management   Unsuccessful Call Note 05/06/2018 Name: Alexis Mccarthy           MRN: [id number]       DOB: 05-01-1960  Alexis Mccarthy is a 58 year old femalewho sees [name], FNP for primary care. [name] the CCM team to consult the patient for chronic care management and care coordination secondary to the need for DM/HTN, Glucometer and Medication education instructions. Referral was placed3/24/2020 during patient's last office visit.  Was unable to reach patient via telephone today forintroduction to [employer]. Ihave left HIPAA compliant voicemail asking patient to return my call. (unsuccessful outreach #3).  Plan: Referral will be closed if patient does not respond within 7 business days as three unsuccessful attempts to engage patient with CCM Services has been made. Will be happy to assist patient at a later time if she wishes to receive CCM Services.   Laurana Magistro E. Rollene Rotunda, RN, BSN Nurse Care Coordinator [facility] Care Management  [phone]

## 2018-06-07 NOTE — Chronic Care Management (AMB) (Signed)
  Care Management   Note  06/07/2018 Name: Alexis Mccarthy MRN: [id number] DOB: 1961-01-19  Alexis Mccarthy a 58year old femalewho seesEmily Uvaldo Mccarthy, FNPfor primary care. [name] the CCM team to consult the patient forchronic care management and care coordination secondary to the need for DM/HTN, Glucometer and Medication education instructions.Referral was placed3/24/2020 during patient's last office visit. Today CCM was able to speak with patient via telephone after multiple attempts to contact and CCM Services was introduced.  SDOH (Social Determinants of Health) screening performed today. See Care Plan Entry related to challenges with: None  Alexis Mccarthy was given information about Care Management services today including:  1. Case Management services include personalized support from designated clinical staff supervised by a physician, including individualized plan of care and coordination with other care providers 2. 24/7 contact phone numbers for assistance for urgent and routine care needs. 3. The patient may stop CCM services at any time (effective at the end of the month) by phone call to the office staff.   Patient agreed to services and verbal consent obtained.     Plan: Initial assessment and goal setting appointment set for 06/24/2018 with CCM RN CM    Alexis Mccarthy E. Rollene Rotunda, RN, BSN Nurse Care Coordinator [facility] Care Management  [phone]

## 2018-06-07 NOTE — Patient Instructions (Signed)
1. Thank You for allowing the CCM (Chronic Care Management) Team to assist you with your healthcare goals!! I look forward to speaking with you on Friday 06/24/2018. 2. Please bring ALL medications to your appointment! If you have a blood sugar meter or a blood pressure monitor at home, bring those as well.  3.  Contact the CCM Team if you have any question or need to reschedule your initial visit.  CCM (Chronic Care Management) Team   Trish Fountain RN, BSN Nurse Care Coordinator  [phone]  Ruben Reason PharmD  Clinical Pharmacist  [phone]   Elliot Gurney, LCSW Clinical Social Worker [phone]  Alexis Mccarthy was given information about Care Management services today including:  1. Case Management services includes personalized support from designated clinical staff supervised by her physician, including individualized plan of care and coordination with other care providers 2. 24/7 contact phone numbers for assistance for urgent and routine care needs. 3. The patient may stop case management services at any time by phone call to the office staff.  Patient agreed to services and verbal consent obtained.

## 2018-06-24 ENCOUNTER — Ambulatory Visit: Payer: Self-pay

## 2018-06-24 ENCOUNTER — Other Ambulatory Visit: Payer: Self-pay

## 2018-06-24 DIAGNOSIS — I10 Essential (primary) hypertension: Secondary | ICD-10-CM

## 2018-06-24 DIAGNOSIS — E1165 Type 2 diabetes mellitus with hyperglycemia: Secondary | ICD-10-CM

## 2018-06-24 NOTE — Chronic Care Management (AMB) (Signed)
Care Management   Initial Visit Note  06/24/2018 Name: Alexis Mccarthy MRN: [id number] DOB: 1960-05-06   Subjective: "I don't know anything at all about diabetes or how to care for myself"  Objective: Lab Results  Component Value Date   HGBA1C 8.2 (H) 04/26/2018   Lab Results  Component Value Date   MICROALBUR 2.2 04/26/2018   [place] 155 (H) 04/26/2018   CREATININE 0.78 04/26/2018    Assessment: Alexis Mccarthy a 58year old femalewho seesEmily Uvaldo Rising, FNPfor primary care. Alexis Mccarthy the CCM team to consult the patient forchronic care management and care coordination secondary to the need for DM/HTN, Glucometer and Medication education instructions.Referral was placed3/24/2020 during patient's last office visit  Review of patient status, including review of consultants reports, relevant laboratory and other test results, and collaboration with appropriate care team members and the patient's provider was performed as part of comprehensive patient evaluation and provision of chronic care management services.    <no information>  Goals Addressed            This Visit's Progress   . I don't know anything about diabetes or how to manage (pt-stated)       Current Barriers:  Marland Kitchen Knowledge Deficits related to basic Diabetes pathophysiology and self care/management . Lack of personal glucometer to monitor blood sugar  Nurse Case Manager Clinical Goal(s):  Marland Kitchen Over the next 14 days, patient will demonstrate improved adherence to prescribed treatment plan for diabetes self care/management as evidenced by checking blood glucose levels as prescribed, adhering to ADA/low carb diet, daily exercise, and medication adherence  Interventions:  . Provided education to patient re: diabetes and self care management . Reviewed medications with patient and discussed importance of medication adherence . Discussed plans with patient for ongoing care management follow up and provided  patient with direct contact information for care management team . Provided patient with written educational materials related to hypo and hyperglycemia and importance of correct treatment . Advised patient, providing education and rationale, to check cbg daily and as needed for symptomatic hypo and hyper glycemia and record, calling [name], FNP for findings outside established parameters.   [name] with PCP to have glucometer order called into pharmacy for patient pick up as order had already been placed 3 times (pharmacy states never got) . Referral to [facility] Pharmacist  Patient Self Care Activities:  . Self administers medications as prescribed . Attends all scheduled provider appointments . Checks blood sugars as prescribed and utilize hyper and hypoglycemia protocol as needed        Adhere to ADA diet as discussed        Exercise daily   Plan:  . Patient will go by pharmacy and pick up glucometer and supplies today  . RN CM will provide patient with written educational materials via postal mail   Initial goal documentation      . My blood pressure is better with the additonal medication but I dont check it reguarly (pt-stated)       Current Barriers:  Marland Kitchen Knowledge Deficits related to basic understanding of hypertension pathophysiology and self care management  Nurse Case Manager Clinical Goal(s):  Marland Kitchen Over the next 14 days, patient will verbalize understanding of plan for hypertension management . Over the next 14 days, patient will demonstrate improved adherence to prescribed treatment plan for hypertension as evidenced by taking all medications as prescribed, monitoring and recording blood pressure as directed, adhering to low sodium/DASH diet  Interventions:  .  Evaluation of current treatment plan related to hypertension self management and patient's adherence to plan as established by provider. . Provided education to patient re: stroke prevention, s/s of  heart attack and stroke, DASH diet, complications of uncontrolled blood pressure . Reviewed medications with patient and discussed importance of compliance . Discussed plans with patient for ongoing care management follow up and provided patient with direct contact information for care management team . Advised patient, providing education and rationale, to monitor blood pressure daily and record, calling PCP for findings outside established parameters.  . Reviewed scheduled/upcoming provider appointments including:   Patient Self Care Activities:  . Self administers medications as prescribed . Attends all scheduled provider appointments . Calls provider office for new concerns, questions, or BP outside discussed parameters . Checks BP and records as discussed . Follows a low sodium diet/DASH diet  Initial goal documentation         Follow up plan:  Telephone follow up appointment with CCM team member scheduled for: 2 weeks  Alexis Mccarthy was given information about Care Management services today including:  1. Case Management services include personalized support from designated clinical staff supervised by a physician, including individualized plan of care and coordination with other care providers 2. 24/7 contact phone numbers for assistance for urgent and routine care needs. 3. The patient may stop CCM services at any time (effective at the end of the month) by phone call to the office staff.  Patient agreed to services and verbal consent obtained.  Lorinda Copland E. Rollene Rotunda, RN, BSN Nurse Care Coordinator [facility] Care Management  [phone]

## 2018-06-24 NOTE — Patient Instructions (Addendum)
Thank you allowing the Chronic Care Management Team to be a part of your care! It was a pleasure speaking with you today!  1. Please continue to take your medications as prescribed. I will have a pharmacist to follow up with you to discuss medication side effects and symptoms. 2. Pick up your glucometer and supplies from pharmacy. Call me if you have an questions about how to check your sugars. 3. Try to exercise (walk) daily. If you can't do 30 min at a time, you can break it up to fit your schedule. 4. I have provided you basic diabetes self care below. I will also send you information on [employer]abetic [employer] diet and Carb Counting. YOU CAN HAVE 45-60 GRAMS OF CARBS AT [facility] MEAL 5. Make sure you are reading labels and looking at serving sizes when you are counting your carbohydrates.  CCM (Chronic Care Management) Team   Trish Fountain RN, BSN Nurse Care Coordinator  [phone]  Ruben Reason PharmD  Clinical Pharmacist  [phone]   Elliot Gurney, LCSW Clinical Social Worker [phone]  Goals Addressed            This Visit's Progress   . I don't know anything about diabetes or how to manage (pt-stated)       Current Barriers:  Marland Kitchen Knowledge Deficits related to basic Diabetes pathophysiology and self care/management . Lack of personal glucometer to monitor blood sugar  Nurse Case Manager Clinical Goal(s):  Marland Kitchen Over the next 14 days, patient will demonstrate improved adherence to prescribed treatment plan for diabetes self care/management as evidenced by checking blood glucose levels as prescribed, adhering to ADA/low carb diet, daily exercise, and medication adherence  Interventions:  . Provided education to patient re: diabetes and self care management . Reviewed medications with patient and discussed importance of medication adherence . Discussed plans with patient for ongoing care management follow up and provided patient with direct contact information for care  management team . Provided patient with written educational materials related to hypo and hyperglycemia and importance of correct treatment . Advised patient, providing education and rationale, to check cbg daily and as needed for symptomatic hypo and hyper glycemia and record, calling [name], FNP for findings outside established parameters.   [name] with PCP to have glucometer order called into pharmacy for patient pick up as order had already been placed 3 times (pharmacy states never got) . Referral to [facility] Pharmacist  Patient Self Care Activities:  . Self administers medications as prescribed . Attends all scheduled provider appointments . Checks blood sugars as prescribed and utilize hyper and hypoglycemia protocol as needed        Adhere to ADA diet as discussed        Exercise daily   Plan:  . Patient will go by pharmacy and pick up glucometer and supplies today  . RN CM will provide patient with written educational materials via postal mail   Initial goal documentation      . My blood pressure is better with the additonal medication but I dont check it reguarly (pt-stated)       Current Barriers:  Marland Kitchen Knowledge Deficits related to basic understanding of hypertension pathophysiology and self care management  Nurse Case Manager Clinical Goal(s):  Marland Kitchen Over the next 14 days, patient will verbalize understanding of plan for hypertension management . Over the next 14 days, patient will demonstrate improved adherence to prescribed treatment plan for hypertension as evidenced by taking all  medications as prescribed, monitoring and recording blood pressure as directed, adhering to low sodium/DASH diet  Interventions:  . Evaluation of current treatment plan related to hypertension self management and patient's adherence to plan as established by provider. . Provided education to patient re: stroke prevention, s/s of heart attack and stroke, DASH diet, complications of  uncontrolled blood pressure . Reviewed medications with patient and discussed importance of compliance . Discussed plans with patient for ongoing care management follow up and provided patient with direct contact information for care management team . Advised patient, providing education and rationale, to monitor blood pressure daily and record, calling PCP for findings outside established parameters.  . Reviewed scheduled/upcoming provider appointments including:   Patient Self Care Activities:  . Self administers medications as prescribed . Attends all scheduled provider appointments . Calls provider office for new concerns, questions, or BP outside discussed parameters . Checks BP and records as discussed . Follows a low sodium diet/DASH diet  Initial goal documentation        Print copy of patient instructions provided.  via postal mail  Telephone follow up appointment with CCM team member scheduled for:14 days  SYMPTOMS OF A STROKE   You have any symptoms of stroke. "BE FAST" is an easy way to remember the main warning signs: ? B - Balance. Signs are dizziness, sudden trouble walking, or loss of balance. ? E - Eyes. Signs are trouble seeing or a sudden change in how you see. ? F - Face. Signs are sudden weakness or loss of feeling of the face, or the face or eyelid drooping on one side. ? A - Arms. Signs are weakness or loss of feeling in an arm. This happens suddenly and usually on one side of the body. ? S - Speech. Signs are sudden trouble speaking, slurred speech, or trouble understanding what people say. ? T - Time. Time to call emergency services. Write down what time symptoms started.  You have other signs of stroke, such as: ? A sudden, very bad headache with no known cause. ? Feeling sick to your stomach (nausea). ? Throwing up (vomiting). ? Jerky movements you cannot control (seizure).  SYMPTOMS OF A HEART ATTACK  What are the signs or symptoms? Symptoms of  this condition include:  Chest pain. It may feel like: ? Crushing or squeezing. ? Tightness, pressure, fullness, or heaviness.  Pain in the arm, neck, jaw, back, or upper body.  Shortness of breath.  Heartburn.  Indigestion.  Nausea.  Cold sweats.  Feeling tired.  Sudden lightheadedness.  Type 2 Diabetes Mellitus, Self Care, Adult When you have type 2 diabetes (type 2 diabetes mellitus), you must make sure your blood sugar (glucose) stays in a healthy range. You can do this with:  Nutrition.  Exercise.  Lifestyle changes.  Medicines or insulin, if needed.  Support from your doctors and others. How to stay aware of blood sugar   Check your blood sugar level every day, as often as told.  Have your A1c (hemoglobin A1c) level checked two or more times a year. Have it checked more often if your doctor tells you to. Your doctor will set personal treatment goals for you. Generally, you should have these blood sugar levels:  Before meals (preprandial): 80-130 mg/dL (4.4-7.2 mmol/L).  After meals (postprandial): below 180 mg/dL (10 mmol/L). THIS IS 2 HOURS AFTER MEAL  A1c level: less than 7%. YOURS WAS 8.2 WHICH MEANS YOUR AVERAGE BLOOD SUGAR IS 215  How to manage  high and low blood sugar Signs of high blood sugar High blood sugar is called hyperglycemia. Know the signs of high blood sugar. Signs may include:  Feeling: ? Thirsty. ? Hungry. ? Very tired.  Needing to pee (urinate) more than usual.  Blurry vision. Signs of low blood sugar Low blood sugar is called hypoglycemia. This is when blood sugar is at or below 70 mg/dL (3.9 mmol/L). Signs may include:  Feeling: ? Hungry. ? Worried or nervous (anxious). ? Sweaty and clammy. ? Confused. ? Dizzy. ? Sleepy. ? Sick to your stomach (nauseous).  Having: ? A fast heartbeat. ? A headache. ? A change in your vision. ? Jerky movements that you cannot control (seizure). ? Tingling or no feeling (numbness)  around your mouth, lips, or tongue.  Having trouble with: ? Moving (coordination). ? Sleeping. ? Passing out (fainting). ? Getting upset easily (irritability). Treating low blood sugar TREAT <70 BELOW, TREAT<90 BUT >70 WITH FOOD To treat low blood sugar, eat or drink something sugary right away. If you can think clearly and swallow safely, follow the 15:15 rule:  Take 15 grams of a fast-acting carb (carbohydrate). Talk with your doctor about how much you should take.  Some fast-acting carbs are: ? Sugar tablets (glucose pills). Take 3-4 pills. ? 6-8 pieces of hard candy. ? 4-6 oz (120-150 mL) of fruit juice. ? 4-6 oz (120-150 mL) of regular (not diet) soda. ? 1 Tbsp (15 mL) honey or sugar.  Check your blood sugar 15 minutes after you take the carb.  If your blood sugar is still at or below 70 mg/dL (3.9 mmol/L), take 15 grams of a carb again.  If your blood sugar does not go above 70 mg/dL (3.9 mmol/L) after 3 tries, get help right away.  After your blood sugar goes back to normal, eat a meal or a snack within 1 hour. Treating very low blood sugar If your blood sugar is at or below 54 mg/dL (3 mmol/L), you have very low blood sugar (severe hypoglycemia). This is an emergency. Do not wait to see if the symptoms will go away. Get medical help right away. Call your local emergency services (911 in the [place] If you have very low blood sugar and you cannot eat or drink, you may need a glucagon shot (injection). A family member or friend should learn how to check your blood sugar and how to give you a glucagon shot. Ask your doctor if you need to have a glucagon shot kit at home. Follow these instructions at home: Medicine  Take insulin and diabetes medicines as told.  If your doctor says you should take more or less insulin and medicines, do this exactly as told.  Do not run out of insulin or medicines. Having diabetes can raise your risk for other long-term conditions. These include  heart disease and kidney disease. Your doctor may prescribe medicines to help you not have these problems. Food   Make healthy food choices. These include: ? Chicken, fish, egg whites, and beans. ? Oats, whole wheat, bulgur, brown rice, quinoa, and millet. ? Fresh fruits and vegetables. ? Low-fat dairy products. ? Nuts, avocado, olive oil, and canola oil.  Meet with a food specialist (dietitian). He or she can help you make an eating plan that is right for you.  Follow instructions from your doctor about what you cannot eat or drink.  Drink enough fluid to keep your pee (urine) pale yellow.  Keep track of carbs that you  eat. Do this by reading food labels and learning food serving sizes.  Follow your sick day plan when you cannot eat or drink normally. Make this plan with your doctor so it is ready to use. Activity  Exercise 3 or more times a week.  Do not go more than 2 days without exercising.  Talk with your doctor before you start a new exercise. Your doctor may need to tell you to change: ? How much insulin or medicines you take. ? How much food you eat. Lifestyle  Do not use any tobacco products. These include cigarettes, chewing tobacco, and e-cigarettes. If you need help quitting, ask your doctor.  Ask your doctor how much alcohol is safe for you.  Learn to deal with stress. If you need help with this, ask your doctor. Body care  Stay up to date with your shots (immunizations).  Have your eyes and feet checked by a doctor as often as told.  Check your skin and feet every day. Check for cuts, bruises, redness, blisters, or sores.  Brush your teeth and gums two times a day. Floss one or more times a day.  Go to the dentist one or more times every 6 months.  Stay at a healthy weight. General instructions  Take over-the-counter and prescription medicines only as told by your doctor.  Share your diabetes care plan with: ? Your work or school. ? People you  live with.  Carry a card or wear jewelry that says you have diabetes.  Keep all follow-up visits as told by your doctor. This is important. Questions to ask your doctor  Do I need to meet with a diabetes educator?  Where can I find a support group for people with diabetes? Where to find more information To learn more about diabetes, visit:  American Diabetes Association: www.diabetes.org  American Association of Diabetes Educators: www.diabeteseducator.org Summary  When you have type 2 diabetes, you must make sure your blood sugar (glucose) stays in a healthy range.  Check your blood sugar every day, as often as told.  Having diabetes can raise your risk for other conditions. Your doctor may prescribe medicines to help you not have these problems.  Keep all follow-up visits as told by your doctor. This is important. This information is not intended to replace advice given to you by your health care provider. Make sure you discuss any questions you have with your health care provider. Document Released: 05/13/2015 Document Revised: 07/12/2017 Document Reviewed: 02/22/2015 Elsevier Interactive Patient Education  2019 [employer].   Diabetes Mellitus and Nutrition, Adult When you have diabetes (diabetes mellitus), it is very important to have healthy eating habits because your blood sugar (glucose) levels are greatly affected by what you eat and drink. Eating healthy foods in the appropriate amounts, at about the same times every day, can help you:  Control your blood glucose.  Lower your risk of heart disease.  Improve your blood pressure.  Reach or maintain a healthy weight. Every person with diabetes is different, and each person has different needs for a meal plan. Your health care provider may recommend that you work with a diet and nutrition specialist (dietitian) to make a meal plan that is best for you. Your meal plan may vary depending on factors such as:  The  calories you need.  The medicines you take.  Your weight.  Your blood glucose, blood pressure, and cholesterol levels.  Your activity level.  Other health conditions you have, such as  heart or kidney disease. How do carbohydrates affect me? Carbohydrates, also called carbs, affect your blood glucose level more than any other type of food. Eating carbs naturally raises the amount of glucose in your blood. Carb counting is a method for keeping track of how many carbs you eat. Counting carbs is important to keep your blood glucose at a healthy level, especially if you use insulin or take certain oral diabetes medicines. It is important to know how many carbs you can safely have in each meal. This is different for every person. Your dietitian can help you calculate how many carbs you should have at each meal and for each snack. Foods that contain carbs include:  Bread, cereal, rice, pasta, and crackers.  Potatoes and corn.  Peas, beans, and lentils.  Milk and yogurt.  Fruit and juice.  Desserts, such as cakes, cookies, ice cream, and candy. How does alcohol affect me? Alcohol can cause a sudden decrease in blood glucose (hypoglycemia), especially if you use insulin or take certain oral diabetes medicines. Hypoglycemia can be a life-threatening condition. Symptoms of hypoglycemia (sleepiness, dizziness, and confusion) are similar to symptoms of having too much alcohol. If your health care provider says that alcohol is safe for you, follow these guidelines:  Limit alcohol intake to no more than 1 drink per day for nonpregnant women and 2 drinks per day for men. One drink equals 12 oz of beer, 5 oz of wine, or 1 oz of hard liquor.  Do not drink on an empty stomach.  Keep yourself hydrated with water, diet soda, or unsweetened iced tea.  Keep in mind that regular soda, juice, and other mixers may contain a lot of sugar and must be counted as carbs. What are tips for following this  plan?  Reading food labels  Start by checking the serving size on the "Nutrition Facts" label of packaged foods and drinks. The amount of calories, carbs, fats, and other nutrients listed on the label is based on one serving of the item. Many items contain more than one serving per package.  Check the total grams (g) of carbs in one serving. You can calculate the number of servings of carbs in one serving by dividing the total carbs by 15. For example, if a food has 30 g of total carbs, it would be equal to 2 servings of carbs.  Check the number of grams (g) of saturated and trans fats in one serving. Choose foods that have low or no amount of these fats.  Check the number of milligrams (mg) of salt (sodium) in one serving. Most people should limit total sodium intake to less than 2,300 mg per day.  Always check the nutrition information of foods labeled as "low-fat" or "nonfat". These foods may be higher in added sugar or refined carbs and should be avoided.  Talk to your dietitian to identify your daily goals for nutrients listed on the label. Shopping  Avoid buying canned, premade, or processed foods. These foods tend to be high in fat, sodium, and added sugar.  Shop around the outside edge of the grocery store. This includes fresh fruits and vegetables, bulk grains, fresh meats, and fresh dairy. Cooking  Use low-heat cooking methods, such as baking, instead of high-heat cooking methods like deep frying.  Cook using healthy oils, such as olive, canola, or sunflower oil.  Avoid cooking with butter, cream, or high-fat meats. Meal planning  Eat meals and snacks regularly, preferably at the same times every  day. Avoid going long periods of time without eating.  Eat foods high in fiber, such as fresh fruits, vegetables, beans, and whole grains. Talk to your dietitian about how many servings of carbs you can eat at each meal.  Eat 4-6 ounces (oz) of lean protein each day, such as lean  meat, chicken, fish, eggs, or tofu. One oz of lean protein is equal to: ? 1 oz of meat, chicken, or fish. ? 1 egg. ?  cup of tofu.  Eat some foods each day that contain healthy fats, such as avocado, nuts, seeds, and fish. Lifestyle  Check your blood glucose regularly.  Exercise regularly as told by your health care provider. This may include: ? 150 minutes of moderate-intensity or vigorous-intensity exercise each week. This could be brisk walking, biking, or water aerobics. ? Stretching and doing strength exercises, such as yoga or weightlifting, at least 2 times a week.  Take medicines as told by your health care provider.  Do not use any products that contain nicotine or tobacco, such as cigarettes and e-cigarettes. If you need help quitting, ask your health care provider.  Work with a Social worker or diabetes educator to identify strategies to manage stress and any emotional and social challenges. Questions to ask a health care provider  Do I need to meet with a diabetes educator?  Do I need to meet with a dietitian?  What number can I call if I have questions?  When are the best times to check my blood glucose? Where to find more information:  American Diabetes Association: diabetes.org  Academy of Nutrition and Dietetics: www.eatright.[employer] of Diabetes and Digestive and Kidney Diseases (NIH): [employer] Summary  A healthy meal plan will help you control your blood glucose and maintain a healthy lifestyle.  Working with a diet and nutrition specialist (dietitian) can help you make a meal plan that is best for you.  Keep in mind that carbohydrates (carbs) and alcohol have immediate effects on your blood glucose levels. It is important to count carbs and to use alcohol carefully. This information is not intended to replace advice given to you by your health care provider. Make sure you discuss any questions you have with your health care  provider. Document Released: 10/16/2004 Document Revised: 08/19/2016 Document Reviewed: 02/24/2016 Elsevier Interactive Patient Education  2019 [employer].

## 2018-07-06 ENCOUNTER — Other Ambulatory Visit: Payer: Self-pay

## 2018-07-06 ENCOUNTER — Ambulatory Visit
Admission: RE | Admit: 2018-07-06 | Discharge: 2018-07-06 | Disposition: A | Discharge status: Home / Self Care | Payer: BLUE CROSS/BLUE SHIELD | Source: Ambulatory Visit | Attending: Family Medicine | Admitting: Family Medicine

## 2018-07-06 DIAGNOSIS — Z1231 Encounter for screening mammogram for malignant neoplasm of breast: Secondary | ICD-10-CM | POA: Diagnosis present

## 2018-07-08 ENCOUNTER — Ambulatory Visit: Payer: Self-pay

## 2018-07-08 ENCOUNTER — Other Ambulatory Visit: Payer: Self-pay

## 2018-07-08 DIAGNOSIS — I10 Essential (primary) hypertension: Secondary | ICD-10-CM

## 2018-07-08 DIAGNOSIS — E1165 Type 2 diabetes mellitus with hyperglycemia: Secondary | ICD-10-CM

## 2018-07-08 NOTE — Chronic Care Management (AMB) (Signed)
Care Management   Follow Up Note   07/08/2018 Name: Alexis Mccarthy MRN: [id number] DOB: 1960/03/02  Referred by: Hubbard Hartshorn, FNP Reason for referral : Chronic Care Management (follow up DM/HTN)    Subjective: "I have been doing real good since I    Objective:  Lab Results  Component Value Date   HGBA1C 8.2 (H) 04/26/2018   BP Readings from Last 3 Encounters:  04/26/18 (!) 160/84  04/15/16 135/90  03/08/16 (!) 148/80      Assessment: Alexis Mccarthy a 58year old femalewho seesEmily Uvaldo Rising, FNPfor primary care. Alexis Mccarthy the CCM team to consult the patient forchronic care management and care coordination secondary to the need for DM/HTN, Glucometer and Medication education instructions.Referral was placed3/24/2020 during patient's last office visit CCM RN CM completed initial assessment and provided educational materials. Today RN CM followed up with Alexis Mccarthy to assess for progression towards established goals.   Review of patient status, including review of consultants reports, relevant laboratory and other test results, and collaboration with appropriate care team members and the patient's provider was performed as part of comprehensive patient evaluation and provision of chronic care management services.    Goals Addressed            This Visit's Progress   . COMPLETED: I don't know anything about diabetes or how to manage (pt-stated)       Alexis Mccarthy is doing very well managing her diabetes. She is checking her fasting blood sugar daily and reports a 7 day average of 144. She is reading labels and counting her carbohydrates. She is walking 5 days a week. She is really proud of the work she has put in over the last 2 weeks to manage her health.   Current Barriers:  Marland Kitchen Knowledge Deficits related to basic Diabetes pathophysiology and self care/management . Lack of personal glucometer to monitor blood sugar  Nurse Case Manager Clinical Goal(s):   Marland Kitchen Over the next 14 days, patient will demonstrate improved adherence to prescribed treatment plan for diabetes self care/management as evidenced by checking blood glucose levels as prescribed, adhering to ADA/low carb diet, daily exercise, and medication adherence  Interventions:  . Provided education to patient re: diabetes and self care management . Reviewed medications with patient and discussed importance of medication adherence . Discussed plans with patient for ongoing care management follow up and provided patient with direct contact information for care management team . Provided patient with written educational materials related to hypo and hyperglycemia and importance of correct treatment . Advised patient, providing education and rationale, to check cbg daily and as needed for symptomatic hypo and hyper glycemia and record, calling [name], FNP for findings outside established parameters.   [name] with PCP to have glucometer order called into pharmacy for patient pick up as order had already been placed 3 times (pharmacy states never got) . Referral to [facility] Pharmacist  Patient Self Care Activities:  . Self administers medications as prescribed . Attends all scheduled provider appointments . Checks blood sugars as prescribed and utilize hyper and hypoglycemia protocol as needed        Adhere to ADA diet as discussed        Exercise daily   Plan:  . Patient will go by pharmacy and pick up glucometer and supplies today  . RN CM will provide patient with written educational materials via postal mail   Initial goal documentation      . COMPLETED:  My blood pressure is better with the additonal medication but I dont check it reguarly (pt-stated)       Alexis Mccarthy is checking her BP several times weekly. Reports 128/80, 134/80. She is monitoring the amount of salt in her diet. She is taking all medications as prescribed.  Current Barriers:  Marland Kitchen Knowledge Deficits  related to basic understanding of hypertension pathophysiology and self care management  Nurse Case Manager Clinical Goal(s):  Marland Kitchen Over the next 14 days, patient will verbalize understanding of plan for hypertension management . Over the next 14 days, patient will demonstrate improved adherence to prescribed treatment plan for hypertension as evidenced by taking all medications as prescribed, monitoring and recording blood pressure as directed, adhering to low sodium/DASH diet  Interventions:  . Evaluation of current treatment plan related to hypertension self management and patient's adherence to plan as established by provider. . Provided education to patient re: stroke prevention, s/s of heart attack and stroke, DASH diet, complications of uncontrolled blood pressure . Reviewed medications with patient and discussed importance of compliance . Discussed plans with patient for ongoing care management follow up and provided patient with direct contact information for care management team . Advised patient, providing education and rationale, to monitor blood pressure daily and record, calling PCP for findings outside established parameters.  . Reviewed scheduled/upcoming provider appointments including:   Patient Self Care Activities:  . Self administers medications as prescribed . Attends all scheduled provider appointments . Calls provider office for new concerns, questions, or BP outside discussed parameters . Checks BP and records as discussed . Follows a low sodium diet/DASH diet  Initial goal documentation         Follow Up Plan: Patient has been provided contact information for CCM RN CM and encouraged to call if additional needs, questions, or concerns arise.     Keithen Capo E. Rollene Rotunda, RN, BSN Nurse Care Coordinator [facility] Care Management  [phone]

## 2018-07-08 NOTE — Patient Instructions (Signed)
Thank you allowing the Chronic Care Management Team to be a part of your care! It was a pleasure speaking with you today!  1. I will reprint and mail your AVS for our last visit. I am sorry you did not receive it in the mail. 2. You are doing a great job managing your hypertension and diabetes. All your numbers look good.j 3. Continue to follow your Diabetes plan of care as discussed. I am glad you have increased your Farxiga back to 1 pill daily. 4. Take all medications as prescribed and keep all scheduled medical appointments.  CCM (Chronic Care Management) Team   Alexis Fountain RN, BSN Nurse Care Coordinator  [phone]  Alexis Mccarthy PharmD  Clinical Pharmacist  [phone]   Alexis Gurney, LCSW Clinical Social Worker [phone]  Goals Addressed            This Visit's Progress   . COMPLETED: I don't know anything about diabetes or how to manage (pt-stated)       Current Barriers:  Marland Kitchen Knowledge Deficits related to basic Diabetes pathophysiology and self care/management . Lack of personal glucometer to monitor blood sugar  Nurse Case Manager Clinical Goal(s):  Marland Kitchen Over the next 14 days, patient will demonstrate improved adherence to prescribed treatment plan for diabetes self care/management as evidenced by checking blood glucose levels as prescribed, adhering to ADA/low carb diet, daily exercise, and medication adherence  Interventions:  . Provided education to patient re: diabetes and self care management . Reviewed medications with patient and discussed importance of medication adherence . Discussed plans with patient for ongoing care management follow up and provided patient with direct contact information for care management team . Provided patient with written educational materials related to hypo and hyperglycemia and importance of correct treatment . Advised patient, providing education and rationale, to check cbg daily and as needed for symptomatic hypo and  hyper glycemia and record, calling Alexis Ensign, FNP for findings outside established parameters.   Alexis Mccarthy with PCP to have glucometer order called into pharmacy for patient pick up as order had already been placed 3 times (pharmacy states never got) . Referral to [facility] Pharmacist  Patient Self Care Activities:  . Self administers medications as prescribed . Attends all scheduled provider appointments . Checks blood sugars as prescribed and utilize hyper and hypoglycemia protocol as needed        Adhere to ADA diet as discussed        Exercise daily   Plan:  . Patient will go by pharmacy and pick up glucometer and supplies today  . RN CM will provide patient with written educational materials via postal mail   Initial goal documentation      . COMPLETED: My blood pressure is better with the additonal medication but I dont check it reguarly (pt-stated)       Current Barriers:  Marland Kitchen Knowledge Deficits related to basic understanding of hypertension pathophysiology and self care management  Nurse Case Manager Clinical Goal(s):  Marland Kitchen Over the next 14 days, patient will verbalize understanding of plan for hypertension management . Over the next 14 days, patient will demonstrate improved adherence to prescribed treatment plan for hypertension as evidenced by taking all medications as prescribed, monitoring and recording blood pressure as directed, adhering to low sodium/DASH diet  Interventions:  . Evaluation of current treatment plan related to hypertension self management and patient's adherence to plan as established by provider. . Provided education to patient re: stroke  prevention, s/s of heart attack and stroke, DASH diet, complications of uncontrolled blood pressure . Reviewed medications with patient and discussed importance of compliance . Discussed plans with patient for ongoing care management follow up and provided patient with direct contact information for care  management team . Advised patient, providing education and rationale, to monitor blood pressure daily and record, calling PCP for findings outside established parameters.  . Reviewed scheduled/upcoming provider appointments including:   Patient Self Care Activities:  . Self administers medications as prescribed . Attends all scheduled provider appointments . Calls provider office for new concerns, questions, or BP outside discussed parameters . Checks BP and records as discussed . Follows a low sodium diet/DASH diet  Initial goal documentation        The patient verbalized understanding of instructions provided today and declined a print copy of patient instruction materials.   The patient has been provided with contact information for the care management team and has been advised to call with any health related questions or concerns.   SYMPTOMS OF A STROKE   You have any symptoms of stroke. "BE FAST" is an easy way to remember the main warning signs: ? B - Balance. Signs are dizziness, sudden trouble walking, or loss of balance. ? E - Eyes. Signs are trouble seeing or a sudden change in how you see. ? F - Face. Signs are sudden weakness or loss of feeling of the face, or the face or eyelid drooping on one side. ? A - Arms. Signs are weakness or loss of feeling in an arm. This happens suddenly and usually on one side of the body. ? S - Speech. Signs are sudden trouble speaking, slurred speech, or trouble understanding what people say. ? T - Time. Time to call emergency services. Write down what time symptoms started.  You have other signs of stroke, such as: ? A sudden, very bad headache with no known cause. ? Feeling sick to your stomach (nausea). ? Throwing up (vomiting). ? Jerky movements you cannot control (seizure).  SYMPTOMS OF A HEART ATTACK  What are the signs or symptoms? Symptoms of this condition include:  Chest pain. It may feel like: ? Crushing or  squeezing. ? Tightness, pressure, fullness, or heaviness.  Pain in the arm, neck, jaw, back, or upper body.  Shortness of breath.  Heartburn.  Indigestion.  Nausea.  Cold sweats.  Feeling tired.  Sudden lightheadedness.

## 2018-07-27 ENCOUNTER — Other Ambulatory Visit: Payer: Self-pay

## 2018-07-27 ENCOUNTER — Other Ambulatory Visit: Payer: Self-pay | Admitting: Family Medicine

## 2018-07-27 ENCOUNTER — Encounter: Payer: Self-pay | Admitting: Family Medicine

## 2018-07-27 ENCOUNTER — Telehealth: Payer: Self-pay | Admitting: Family Medicine

## 2018-07-27 ENCOUNTER — Ambulatory Visit (INDEPENDENT_AMBULATORY_CARE_PROVIDER_SITE_OTHER): Payer: Self-pay | Admitting: Family Medicine

## 2018-07-27 DIAGNOSIS — E1165 Type 2 diabetes mellitus with hyperglycemia: Secondary | ICD-10-CM

## 2018-07-27 DIAGNOSIS — E1169 Type 2 diabetes mellitus with other specified complication: Secondary | ICD-10-CM

## 2018-07-27 DIAGNOSIS — I10 Essential (primary) hypertension: Secondary | ICD-10-CM

## 2018-07-27 DIAGNOSIS — E663 Overweight: Secondary | ICD-10-CM

## 2018-07-27 DIAGNOSIS — M5442 Lumbago with sciatica, left side: Secondary | ICD-10-CM

## 2018-07-27 DIAGNOSIS — G8929 Other chronic pain: Secondary | ICD-10-CM

## 2018-07-27 DIAGNOSIS — E785 Hyperlipidemia, unspecified: Secondary | ICD-10-CM

## 2018-07-27 DIAGNOSIS — F419 Anxiety disorder, unspecified: Secondary | ICD-10-CM

## 2018-07-27 DIAGNOSIS — D860 Sarcoidosis of lung: Secondary | ICD-10-CM

## 2018-07-27 DIAGNOSIS — K219 Gastro-esophageal reflux disease without esophagitis: Secondary | ICD-10-CM

## 2018-07-27 DIAGNOSIS — K760 Fatty (change of) liver, not elsewhere classified: Secondary | ICD-10-CM

## 2018-07-27 DIAGNOSIS — M5441 Lumbago with sciatica, right side: Secondary | ICD-10-CM

## 2018-07-27 DIAGNOSIS — F329 Major depressive disorder, single episode, unspecified: Secondary | ICD-10-CM

## 2018-07-27 DIAGNOSIS — F32A Depression, unspecified: Secondary | ICD-10-CM

## 2018-07-27 MED ORDER — TIZANIDINE HCL 4 MG PO TABS: 2.0000 mg | ORAL_TABLET | Freq: Three times a day (TID) | ORAL | 0 refills | Status: DC | PRN

## 2018-07-27 NOTE — Progress Notes (Signed)
Name: Alexis Mccarthy   MRN: [id number]    DOB: 17-Jun-1960   Date:07/27/2018       Progress Note  Subjective  Chief Complaint  Chief Complaint  Patient presents with  . Follow-up  . Medication Refill    I connected with  Alexis Mccarthy on 07/27/18 at  7:20 AM EDT by telephone and verified that I am speaking with the correct person using two identifiers.  I discussed the limitations, risks, security and privacy concerns of performing an evaluation and management service by telephone and the availability of in person appointments. Staff also discussed with the patient that there may be a patient responsible charge related to this service. Patient Location: Home Provider Location: Home Additional Individuals present: None  HPI  HTN: 127/78, 128/88, 139/91 - Over the last week. -does take medications as prescribed - current regimen includes Lisinopril 41m and HCTZ 12.542m- has been out for about 1 week. - taking medications as instructed, no medication side effects noted, no TIAs, no chest pain on exertion, no dyspnea on exertion, no swelling of ankles - DASH diet discussed - pt somewhat follows a low sodium diet; adds some salt to food and to cooking  Overweight: She walks a few times a week, walks at work (works as a [employer] [place] She is not following any particular diet and finds herself overeating at times.  Sciatic Nerve Pain: Normally on the right side, but can occur in both.  Has been managing on her own on and off for many years on and off, but lately her left side has been worse.  She has not taken medication for this in the past.  Position changes, sitting down/rest provide relief.  Discussed muscle relaxers and prednsione, stretching exercises.  We will send in Tizanidine today.  Hyperlipidemia: Current Medication Regimen: Last Lipids:  Lab Results  Component Value Date   CHOL 235 (H) 04/26/2018   HDL 49 (L)  04/26/2018   LDLCALC 155 (H) 04/26/2018   TRIG 177 (H) 04/26/2018   CHOLHDL 4.8 04/26/2018  - Current Diet: - Denies: Chest pain, shortness of breath, myalgias. - Documented aortic atherosclerosis? 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  Does have some mild DOE.  She was Rx'd Qvar but never used it.  Uses albuterol PRN.   GERD: - Current medication regimen: Was on Zantac, but was recalled.  Now taking protonix twice daily and this seems to work well for her. She is aware of long term risk of PPI use, wants to remain on the medication.  - Possible Triggers: Spicy foods, cola's or anything with caffeine. - Endorses: Burning in her throat. - Denies: weight loss, dysphagia, black stools or history of GI bleeding  Diabetes mellitus type 2 Checking sugars?  yes How often? daily Range (low to high) over last two weeks:  140-150. Does patient feel additional teaching/training would be helpful? Talking to [employer] DM education.  Trying to limit white bread, white rice, white potatoes, sweets?  yes Trying to limit sweetened drinks like iced tea, soft drinks, sports drinks, fruit juices? yes - she has cut out sodas, has stopped eating after dinner.  Checking feet every day/night?  yes Last eye exam: Had with [facility].  Denies: Polyuria, polydipsia, polyphagia, vision changes, or neuropathy. Most recent A1C: Last  A1C was 8.2% and was started on Farxiga and restarted daily metformin.  Is doing well on this. Last CMP Results : is due for repeat today Comprehensive Metabolic Panel:    Component Value Date/Time   NA 140 04/26/2018 1041   NA 137 03/27/2013 2314   K 3.5 04/26/2018 1041   K 3.5 03/27/2013 2314   CL 99 04/26/2018 1041   CL 102 03/27/2013 2314   CO2 30 04/26/2018 1041   CO2 31 03/27/2013 2314   BUN 12  04/26/2018 1041   BUN 12 03/27/2013 2314   CREATININE 0.78 04/26/2018 1041   GLUCOSE 150 (H) 04/26/2018 1041   GLUCOSE 123 (H) 03/27/2013 2314   CALCIUM 9.2 04/26/2018 1041   CALCIUM 8.8 03/27/2013 2314   AST 54 (H) 04/26/2018 1041   AST 26 10/14/2012 1407   ALT 63 (H) 04/26/2018 1041   ALT 31 10/14/2012 1407   ALKPHOS 61 04/06/2015 1316   ALKPHOS 72 10/14/2012 1407   BILITOT 0.6 04/26/2018 1041   BILITOT 0.4 10/14/2012 1407   PROT 7.3 04/26/2018 1041   PROT 8.1 10/14/2012 1407   ALBUMIN 4.2 04/06/2015 1316   ALBUMIN 4.2 10/14/2012 1407   Urine Micro UTD? No Current Medication Management: Diabetic Medications: Taking Metformin once daily and [place] daily.  ACEI/ARB: Yes Statin: Taking Aspirin therapy: No  Hepatic Steatosis:  Has history fatty liver, had elevated LFT's last time.   Anxiety and Depression: Has been on several different options, cymbalta stopped working after taking for several years, buspar made her too sleepy.  Was recommended to go to therapy but was not able to afford. She was put on Trazodone for sleep and it caused to much drowsiness the next day.  She feels that her anxiety is worse than the depression.  Does not have difficulty sleeping now, and denies any recent panic attacks. Taking Celexa '20mg'$ , discussed going up to 30 or '40mg'$ , but she declines to do so today.   Patient Active Problem List   Diagnosis Date Noted  . Hyperlipidemia associated with type 2 diabetes mellitus ([place]) 04/27/2018  . Allergic rhinitis 04/26/2018  . Anxiety and depression 04/26/2018  . DJD (degenerative joint disease) 04/26/2018  . GERD (gastroesophageal reflux disease) 04/26/2018  . Hyperlipidemia 04/26/2018  . Hypertension 04/26/2018  . Migraines 04/26/2018  . Pelvic pain in female 04/26/2018  . Snoring 04/26/2018  . Tinnitus, bilateral 04/26/2018  . 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 Overweight (BMI 25.0-29.9) 04/26/2018  . Hepatic steatosis 04/13/2017  . DM  (diabetes mellitus), type 2 ([place]) 04/03/2015    Past Surgical History:  Procedure Laterality Date  . ABDOMINAL HYSTERECTOMY    . APPENDECTOMY    . BREAST BIOPSY Left    neg  . CHOLECYSTECTOMY    . COLONOSCOPY WITH PROPOFOL N/A 04/15/2016   Procedure: COLONOSCOPY WITH PROPOFOL;  Surgeon: Manya Silvas, MD;  Location[facility] ENDOSCOPY;  Service: Endoscopy;  Laterality: N/A;  . ESOPHAGOGASTRODUODENOSCOPY (EGD) WITH PROPOFOL N/A 04/15/2016   Procedure: ESOPHAGOGASTRODUODENOSCOPY (EGD) WITH PROPOFOL;  Surgeon: Manya Silvas, MD;  [facility] ENDOSCOPY;  Service: Endoscopy;  Laterality: N/A;  . LUNG BIOPSY    . TONSILLECTOMY      Family History  Problem Relation Age of Onset  . Breast cancer Maternal Aunt   . Breast cancer Sister 59    Social History   Socioeconomic History  . Marital status: Married    Spouse name: Fritz Pickerel  . Number of children: 3  . Years of education:  Not on file  . Highest education level: Not on file  Occupational History  . Not on file  Social Needs  . Financial resource strain: Not hard at all  . Food insecurity    Worry: Never true    Inability: Never true  . Transportation needs    Medical: No    Non-medical: No  Tobacco Use  . Smoking status: Never Smoker  . Smokeless tobacco: Never Used  Substance and Sexual Activity  . Alcohol use: No  . Drug use: No  . Sexual activity: Yes    Birth control/protection: Surgical  Lifestyle  . Physical activity    Days per week: 0 days    Minutes per session: 0 min  . Stress: Only a little  Relationships  . Social connections    Talks on phone: More than three times a week    Gets together: More than three times a week    Attends religious service: Never    Active member of club or organization: No    Attends meetings of clubs or organizations: Never    Relationship status: Married  . Intimate partner violence    Fear of current or ex partner: No    Emotionally abused: No    Physically abused:  No    Forced sexual activity: No  Other Topics Concern  . Not on file  Social History Narrative  . Not on file     Current Outpatient Medications:  .  blood glucose meter kit and supplies KIT, Dispense based on patient and insurance preference. Use once daily in the mornings and 2-3 times daily PRN, Disp: 1 each, Rfl: 0 .  citalopram (CELEXA) 20 MG tablet, Take 1 tablet (20 mg total) by mouth daily., Disp: 90 tablet, Rfl: 1 .  dapagliflozin propanediol (FARXIGA) 10 MG TABS tablet, Take 1/2 tablet once daily x7 days, then increase to 1 tablet once daily, Disp: 90 tablet, Rfl: 1 .  hydrochlorothiazide (HYDRODIURIL) 12.5 MG tablet, Take 1 tablet (12.5 mg total) by mouth daily. Take 2 tablets by mouth daily, Disp: 90 tablet, Rfl: 1 .  lisinopril (PRINIVIL,ZESTRIL) 40 MG tablet, Take 1 tablet (40 mg total) by mouth daily., Disp: 90 tablet, Rfl: 1 .  meloxicam (MOBIC) 7.5 MG tablet, Take 1 tablet (7.5 mg total) by mouth daily as needed for pain., Disp: 30 tablet, Rfl: 1 .  metFORMIN (GLUCOPHAGE) 500 MG tablet, Take 500 mg by mouth daily with breakfast., Disp: , Rfl:  .  nystatin-triamcinolone ointment (MYCOLOG), Apply 1 application topically 2 (two) times daily., Disp: , Rfl:  .  pantoprazole (PROTONIX) 40 MG tablet, Take 40 mg by mouth 2 (two) times daily., Disp: , Rfl:  .  potassium chloride (K-DUR,KLOR-CON) 10 MEQ tablet, Take 10 mEq by mouth daily., Disp: , Rfl:  .  rosuvastatin (CRESTOR) 10 MG tablet, Take 1 tablet (10 mg total) by mouth daily., Disp: 90 tablet, Rfl: 3 .  SUMAtriptan (IMITREX) 100 MG tablet, Take 100 mg by mouth every 2 (two) hours as needed for migraine. May repeat in 2 hours if headache persists or recurs., Disp: , Rfl:  .  valACYclovir (VALTREX) 500 MG tablet, Take 1 tablet (500 mg total) by mouth daily as needed., Disp: 90 tablet, Rfl: 1 .  busPIRone (BUSPAR) 7.5 MG tablet, TAKE 1 TABLET ONCE DAILY AT NIGHT. MAY TAKE 1 TABLET AS NEEDED FOR ANXIETY DURING THE DAY AS WELL.  (Patient not taking: Reported on 06/24/2018), Disp: 180 tablet, Rfl: 1  No  Known Allergies  I personally reviewed active problem list, medication list, allergies, family history, social history, notes from last encounter, lab results with the patient/caregiver today.   ROS Constitutional: Negative for fever or weight change.  Respiratory: Negative for cough and shortness of breath.   Cardiovascular: Negative for chest pain or palpitations.  Gastrointestinal: Negative for abdominal pain, no bowel changes.  Musculoskeletal: Negative for gait problem or joint swelling.  Skin: Negative for rash.  Neurological: Negative for dizziness or headache.  No other specific complaints in a complete review of systems (except as listed in HPI above).  Objective  Virtual encounter, vitals not obtained.  There is no height or weight on file to calculate BMI.  Physical Exam  Pulmonary/Chest: Effort normal. No respiratory distress. Speaking in complete sentences Neurological: Pt is alert and oriented to person, place, and time. Speech is normal.  Psychiatric: Patient has a normal mood and affect. behavior is normal. Judgment and thought content normal.  No results found for this or any previous visit (from the past 72 hour(s)).  PHQ2/9: Depression screen [facility] 2/9 07/27/2018 04/26/2018  Decreased Interest 0 2  Down, Depressed, Hopeless 0 1  PHQ - 2 Score 0 3  Altered sleeping 0 1  Tired, decreased energy 0 2  Change in appetite 0 1  Feeling bad or failure about yourself  0 1  Trouble concentrating 0 1  Moving slowly or fidgety/restless 0 1  Suicidal thoughts 0 1  PHQ-9 Score 0 11  Difficult doing work/chores Not difficult at all Somewhat difficult   PHQ-2/9 Result is negative.    Fall Risk: Fall Risk  07/27/2018 04/26/2018  Falls in the past year? 0 0  Number falls in past yr: 0 0  Injury with Fall? 0 0  Follow up Falls evaluation completed Falls evaluation completed    Assessment & Plan   1. Essential hypertension - BP is well controlled at home - COMPLETE METABOLIC PANEL WITH GFR  2. Overweight (BMI 25.0-29.9) - She is congratulated on cutting out sodas and making some major lifestyle changes. - Discussed importance of 150 minutes of physical activity weekly, eat two servings of fish weekly, eat one serving of tree nuts ( cashews, pistachios, pecans, almonds.Marland Kitchen) every other day, eat 6 servings of fruit/vegetables daily and drink plenty of water and avoid sweet beverages.   3. Hyperlipidemia associated with type 2 diabetes mellitus ([place]) - Taking stating as prescribed, she is congratulated on all of the changes she has made since last visit. - Lipid panel  4. Chronic bilateral low back pain with bilateral sciatica - tiZANidine (ZANAFLEX) 4 MG tablet; Take 0.5-1 tablets (2-4 mg total) by mouth every 8 (eight) hours as needed for muscle spasms.  Dispense: 30 tablet; Refill: 0  5. Sarcoidosis of lung ([place]) - Doing well, albuterol PRN  6. Gastroesophageal reflux disease without esophagitis - Doing well cutting out caffeine, wants to stay on protonix  7. Type 2 diabetes mellitus with hyperglycemia, without long-term current use of insulin ([place]) - She is doing very well on her current medication regimen, has been very active in DM education and is congratulated on her great progress. - Hemoglobin A1c  8. Hepatic steatosis - CMP today to recheck  9. Anxiety and depression - Doing okay on Cymbalta, Buspar made her too sleepy, does not want to try another option at this time.  I discussed the assessment and treatment plan with the patient. The patient was provided an opportunity to ask questions and  all were answered. The patient agreed with the plan and demonstrated an understanding of the instructions.   The patient was advised to call back or seek an in-person evaluation if the symptoms worsen or if the condition fails to improve as anticipated.  I provided 26 minutes  of non-face-to-face time during this encounter.  Hubbard Hartshorn, FNP

## 2018-07-27 NOTE — Telephone Encounter (Signed)
Pt called stating she went to get hydrochlorothiazide  refilled today and it was denied. Pt would like to know when she will be able to have medication refilled because she is completely out. Please advise .

## 2018-07-27 NOTE — Telephone Encounter (Signed)
Called into pharmacy

## 2018-07-28 ENCOUNTER — Other Ambulatory Visit: Payer: Self-pay | Admitting: Family Medicine

## 2018-07-28 DIAGNOSIS — K760 Fatty (change of) liver, not elsewhere classified: Secondary | ICD-10-CM

## 2018-07-28 DIAGNOSIS — E1165 Type 2 diabetes mellitus with hyperglycemia: Secondary | ICD-10-CM

## 2018-07-28 LAB — HEMOGLOBIN A1C
Hgb A1c MFr Bld: 8.2 % of total Hgb — ABNORMAL HIGH (ref <5.7)
Mean Plasma Glucose: 189 (calc)
eAG (mmol/L): 10.4 (calc)

## 2018-07-28 LAB — COMPLETE METABOLIC PANEL WITH GFR
AG Ratio: 1.7 (calc) (ref 1.0–2.5)
ALT: 33 U/L — ABNORMAL HIGH (ref 6–29)
AST: 25 U/L (ref 10–35)
Albumin: 4.4 g/dL (ref 3.6–5.1)
Alkaline phosphatase (APISO): 61 U/L (ref 37–153)
BUN/Creatinine Ratio: 18 (calc) (ref 6–22)
BUN: 19 mg/dL (ref 7–25)
CO2: 30 mmol/L (ref 20–32)
Calcium: 9.8 mg/dL (ref 8.6–10.4)
Chloride: 97 mmol/L — ABNORMAL LOW (ref 98–110)
Creat: 1.08 mg/dL — ABNORMAL HIGH (ref 0.50–1.05)
GFR, Est African American: 65 mL/min/{1.73_m2} (ref >=60)
GFR, Est Non African American: 56 mL/min/{1.73_m2} — ABNORMAL LOW (ref >=60)
Globulin: 2.6 g/dL (calc) (ref 1.9–3.7)
Glucose, Bld: 160 mg/dL — ABNORMAL HIGH (ref 65–99)
Potassium: 4.3 mmol/L (ref 3.5–5.3)
Sodium: 140 mmol/L (ref 135–146)
Total Bilirubin: 0.5 mg/dL (ref 0.2–1.2)
Total Protein: 7 g/dL (ref 6.1–8.1)

## 2018-07-28 LAB — LIPID PANEL
Cholesterol: 134 mg/dL (ref <200)
HDL: 45 mg/dL — ABNORMAL LOW (ref >=50)
LDL Cholesterol (Calc): 67 mg/dL (calc)
Non-HDL Cholesterol (Calc): 89 mg/dL (calc) (ref <130)
Total CHOL/HDL Ratio: 3 (calc) (ref <5.0)
Triglycerides: 134 mg/dL (ref <150)

## 2018-08-10 ENCOUNTER — Telehealth: Payer: Self-pay | Admitting: Family Medicine

## 2018-08-10 NOTE — Telephone Encounter (Signed)
Pt.notified

## 2018-08-10 NOTE — Telephone Encounter (Signed)
-----   Message from Hubbard Hartshorn, [place] sent at 07/28/2018  7:16 AM EDT ----- Regarding: Please call patient to come in to recheck kidney function labs Please call patient to come in to recheck kidney function labs

## 2018-08-15 ENCOUNTER — Other Ambulatory Visit: Payer: Self-pay

## 2018-08-15 DIAGNOSIS — K760 Fatty (change of) liver, not elsewhere classified: Secondary | ICD-10-CM

## 2018-08-15 DIAGNOSIS — E1165 Type 2 diabetes mellitus with hyperglycemia: Secondary | ICD-10-CM

## 2018-08-16 LAB — COMPLETE METABOLIC PANEL WITH GFR
AG Ratio: 1.7 (calc) (ref 1.0–2.5)
ALT: 36 U/L — ABNORMAL HIGH (ref 6–29)
AST: 26 U/L (ref 10–35)
Albumin: 4.3 g/dL (ref 3.6–5.1)
Alkaline phosphatase (APISO): 65 U/L (ref 37–153)
BUN: 22 mg/dL (ref 7–25)
CO2: 31 mmol/L (ref 20–32)
Calcium: 9.7 mg/dL (ref 8.6–10.4)
Chloride: 98 mmol/L (ref 98–110)
Creat: 1.05 mg/dL (ref 0.50–1.05)
GFR, Est African American: 67 mL/min/{1.73_m2} (ref >=60)
GFR, Est Non African American: 58 mL/min/{1.73_m2} — ABNORMAL LOW (ref >=60)
Globulin: 2.6 g/dL (calc) (ref 1.9–3.7)
Glucose, Bld: 147 mg/dL — ABNORMAL HIGH (ref 65–99)
Potassium: 3.9 mmol/L (ref 3.5–5.3)
Sodium: 137 mmol/L (ref 135–146)
Total Bilirubin: 0.4 mg/dL (ref 0.2–1.2)
Total Protein: 6.9 g/dL (ref 6.1–8.1)

## 2018-08-29 ENCOUNTER — Encounter: Payer: Self-pay | Admitting: Family Medicine

## 2018-08-29 DIAGNOSIS — K219 Gastro-esophageal reflux disease without esophagitis: Secondary | ICD-10-CM

## 2018-08-29 DIAGNOSIS — K760 Fatty (change of) liver, not elsewhere classified: Secondary | ICD-10-CM

## 2018-08-29 MED ORDER — PANTOPRAZOLE SODIUM 40 MG PO TBEC: 40.0000 mg | DELAYED_RELEASE_TABLET | Freq: Every day | ORAL | 1 refills | Status: DC

## 2018-09-08 ENCOUNTER — Other Ambulatory Visit: Payer: Self-pay | Admitting: Family Medicine

## 2018-09-16 ENCOUNTER — Telehealth: Payer: Self-pay | Admitting: Emergency Medicine

## 2018-09-16 NOTE — Telephone Encounter (Signed)
Copied from [place] [id number]. Topic: Appointment Scheduling - Prior Auth Required for Appointment >> Sep 15, 2018  7:41 AM Alexis Mccarthy, Helene Kelp D wrote: No appointment has been scheduled. Patient is requesting acute visit for sore throat appointment. Patient has been with pain couple of days. Per scheduling protocol, this appointment requires a prior authorization prior to scheduling.  Route to department's PEC pool.

## 2018-09-16 NOTE — Telephone Encounter (Signed)
Spoke to patient, she will make appointment for phone call appointment if know better

## 2018-09-16 NOTE — Telephone Encounter (Signed)
Agree with disposition. 

## 2018-10-13 ENCOUNTER — Encounter: Payer: Self-pay | Admitting: Family Medicine

## 2018-10-13 DIAGNOSIS — I10 Essential (primary) hypertension: Secondary | ICD-10-CM

## 2018-10-13 DIAGNOSIS — G8929 Other chronic pain: Secondary | ICD-10-CM

## 2018-10-13 MED ORDER — HYDROCHLOROTHIAZIDE 12.5 MG PO TABS: 12.5000 mg | ORAL_TABLET | Freq: Every day | ORAL | 1 refills | Status: DC

## 2018-10-13 MED ORDER — TIZANIDINE HCL 4 MG PO TABS: 2.0000 mg | ORAL_TABLET | Freq: Three times a day (TID) | ORAL | 0 refills | Status: AC | PRN

## 2018-10-17 ENCOUNTER — Other Ambulatory Visit: Payer: Self-pay | Admitting: Family Medicine

## 2018-10-17 DIAGNOSIS — I10 Essential (primary) hypertension: Secondary | ICD-10-CM

## 2018-10-17 NOTE — Telephone Encounter (Signed)
Requested medication (s) are due for refill today: yes  Requested medication (s) are on the active medication list: yes  Last refill:  10/13/2018  Future visit scheduled: yes  Notes to clinic: RECEIVED A SCRIPT FOR 1/DAY, SHE TAKES 2/DAY,   Requested Prescriptions  Pending Prescriptions Disp Refills   hydrochlorothiazide (HYDRODIURIL) 12.5 MG tablet [Pharmacy Med Name: HYDROCHLOROTHIAZIDE 12.5 MG TB] 180 tablet 0    Sig: TAKE 2 TABLETS BY MOUTH DAILY     Cardiovascular: Diuretics - Thiazide Failed - 10/17/2018  9:01 AM      Failed - Last BP in normal range    BP Readings from Last 1 Encounters:  04/26/18 (!) 160/84         Passed - Ca in normal range and within 360 days    Calcium  Date Value Ref Range Status  08/15/2018 9.7 8.6 - 10.4 mg/dL Final   Calcium, Total  Date Value Ref Range Status  03/27/2013 8.8 8.5 - 10.1 mg/dL Final         Passed - Cr in normal range and within 360 days    Creat  Date Value Ref Range Status  08/15/2018 1.05 0.50 - 1.05 mg/dL Final    Comment:    For patients >71 years of age, the reference limit for Creatinine is approximately 13% higher for people identified as African-American. .          Passed - K in normal range and within 360 days    Potassium  Date Value Ref Range Status  08/15/2018 3.9 3.5 - 5.3 mmol/L Final  03/27/2013 3.5 3.5 - 5.1 mmol/L Final         Passed - Na in normal range and within 360 days    Sodium  Date Value Ref Range Status  08/15/2018 137 135 - 146 mmol/L Final  03/27/2013 137 136 - 145 mmol/L Final         Passed - Valid encounter within last 6 months    Recent Outpatient Visits          2 months ago Essential hypertension   [place], [place], FNP   5 months ago Essential hypertension   [place], Astrid Divine, [place]      Future Appointments            In 2 weeks Hubbard Hartshorn, [facility], [facility]

## 2018-10-18 ENCOUNTER — Encounter: Payer: Self-pay | Admitting: Family Medicine

## 2018-10-18 DIAGNOSIS — I10 Essential (primary) hypertension: Secondary | ICD-10-CM

## 2018-10-18 MED ORDER — HYDROCHLOROTHIAZIDE 12.5 MG PO TABS: 25.0000 mg | ORAL_TABLET | Freq: Every day | ORAL | 1 refills | Status: DC

## 2018-10-18 NOTE — Telephone Encounter (Signed)
script send on 9/10

## 2018-10-18 NOTE — Telephone Encounter (Signed)
lvm meds called in to pharm

## 2018-10-27 ENCOUNTER — Encounter: Payer: Self-pay | Admitting: Family Medicine

## 2018-10-27 MED ORDER — VALACYCLOVIR HCL 500 MG PO TABS: 500.0000 mg | ORAL_TABLET | Freq: Every day | ORAL | 1 refills | Status: DC | PRN

## 2018-11-02 ENCOUNTER — Encounter: Payer: Self-pay | Admitting: Family Medicine

## 2018-11-02 ENCOUNTER — Other Ambulatory Visit: Payer: Self-pay

## 2018-11-02 ENCOUNTER — Ambulatory Visit (INDEPENDENT_AMBULATORY_CARE_PROVIDER_SITE_OTHER): Payer: Self-pay | Admitting: Family Medicine

## 2018-11-02 VITALS — BP 128/95 | HR 80

## 2018-11-02 DIAGNOSIS — L739 Follicular disorder, unspecified: Secondary | ICD-10-CM

## 2018-11-02 DIAGNOSIS — M19049 Primary osteoarthritis, unspecified hand: Secondary | ICD-10-CM

## 2018-11-02 DIAGNOSIS — N951 Menopausal and female climacteric states: Secondary | ICD-10-CM

## 2018-11-02 DIAGNOSIS — E1165 Type 2 diabetes mellitus with hyperglycemia: Secondary | ICD-10-CM

## 2018-11-02 DIAGNOSIS — K219 Gastro-esophageal reflux disease without esophagitis: Secondary | ICD-10-CM

## 2018-11-02 DIAGNOSIS — E785 Hyperlipidemia, unspecified: Secondary | ICD-10-CM

## 2018-11-02 DIAGNOSIS — F419 Anxiety disorder, unspecified: Secondary | ICD-10-CM

## 2018-11-02 DIAGNOSIS — D860 Sarcoidosis of lung: Secondary | ICD-10-CM

## 2018-11-02 DIAGNOSIS — E1169 Type 2 diabetes mellitus with other specified complication: Secondary | ICD-10-CM

## 2018-11-02 DIAGNOSIS — E663 Overweight: Secondary | ICD-10-CM

## 2018-11-02 DIAGNOSIS — I10 Essential (primary) hypertension: Secondary | ICD-10-CM

## 2018-11-02 DIAGNOSIS — F329 Major depressive disorder, single episode, unspecified: Secondary | ICD-10-CM

## 2018-11-02 DIAGNOSIS — M2560 Stiffness of unspecified joint, not elsewhere classified: Secondary | ICD-10-CM

## 2018-11-02 MED ORDER — VENLAFAXINE HCL ER 37.5 MG PO CP24: ORAL_CAPSULE | ORAL | 2 refills | Status: DC

## 2018-11-02 MED ORDER — DAPAGLIFLOZIN PROPANEDIOL 10 MG PO TABS: ORAL_TABLET | ORAL | 1 refills | Status: DC

## 2018-11-02 MED ORDER — METFORMIN HCL 500 MG PO TABS: 1000.0000 mg | ORAL_TABLET | Freq: Two times a day (BID) | ORAL | 1 refills | Status: AC

## 2018-11-02 MED ORDER — LISINOPRIL 40 MG PO TABS: 40.0000 mg | ORAL_TABLET | Freq: Every day | ORAL | 1 refills | Status: DC

## 2018-11-02 MED ORDER — DICLOFENAC SODIUM 75 MG PO TBEC: 75.0000 mg | DELAYED_RELEASE_TABLET | Freq: Two times a day (BID) | ORAL | 1 refills | Status: DC | PRN

## 2018-11-02 MED ORDER — NYSTATIN-TRIAMCINOLONE 100000-0.1 UNIT/GM-% EX OINT: 1.0000 "application " | TOPICAL_OINTMENT | Freq: Two times a day (BID) | CUTANEOUS | 2 refills | Status: AC

## 2018-11-02 MED ORDER — FAMOTIDINE 40 MG PO TABS: 40.0000 mg | ORAL_TABLET | Freq: Every day | ORAL | 1 refills | Status: DC

## 2018-11-02 NOTE — Progress Notes (Signed)
Name: Alexis Mccarthy   MRN: [id number]    DOB: [birth year]   Date:11/02/2018       Progress Note  Subjective  Chief Complaint  Chief Complaint  Patient presents with  . Follow-up    3 Month Follow Up  . Hypertension  . Diabetes    BS this morning was 136 before meal    I connected with  Angelique Blonder on 11/02/18 at  7:40 AM EDT by telephone and verified that I am speaking with the correct person using two identifiers.  I discussed the limitations, risks, security and privacy concerns of performing an evaluation and management service by telephone and the availability of in person appointments. Staff also discussed with the patient that there may be a patient responsible charge related to this service. Patient Location: Home Provider Location: Home Additional Individuals present: None  HPI  HTN: 128/95, HR 80 today - she states other readings over the last week have showed 120's/80's. -doestake medications as prescribed - current regimen includes Lisinopril 64m and HCTZ 12.56m -taking medications as instructed, no medication side effects noted, no TIAs, no chest pain on exertion, no dyspnea on exertion, no swelling of ankles - DASH diet discussed - ptsomewhatfollowsa low sodium diet; adds some salt to food and to cooking  Overweight:She walks a few times a week, walks at work (works as a [employer] She is not following any particular diet and finds herself overeating at times. She notes her weight is down to 161lbs. No other changes.  Hyperlipidemia: Current Medication Regimen: -Denies: Chest pain, shortness of breath, myalgias. 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 Does have some mild DOE. She was Rx'd  Qvar but never used it. Uses albuterol PRN. Doing well, no changes.  GERD: - Current medication regimen:Was on Zantac, but was recalled. Now taking protonix twice daily and this seems to work well for her. She is aware of long term risk of PPI use, wants to remain on the medication.  She notes that she is taking protonix daily and tums PRN but still having symptoms of heartburn.  - Possible Triggers:Spicy foods, cola's or anything with caffeine. - Endorses:Burning in her throat. - Denies:weight loss, dysphagia, black stools or history of GI bleeding  Diabetes mellitustype 2 Checking sugars?yes How often?daily Range (low to high) over last two weeks:136 this morning fasting; not checking daily. Does patient feel additional teaching/training would be helpful?Talking to [employer] DM education. Trying to limit white bread, white rice, white potatoes, sweets?yes Trying to limit sweetened drinks like iced tea, soft drinks, sports drinks, fruit juices?yes - she has cut out sodas, has stopped eating after dinner.  Checking feet every day/night?yes Last eye exam:Had with [facility]. Denies: Polyuria, polydipsia, polyphagia, vision changes; having some mild neuropathy in bilateral feet - happens occasionally when sitting for too long - feels like pins in her feet. Most recent A1C:Last A1C was 8.2% and was started on Farxiga and restarted daily metformin.  Is doing well on this, however she was told to increase her Metformin - though this was only for a few weeks, so she is back down to low dose.  Advised she needs to take 1000100mID and she is agreeable to this change today. Last CMP Results :isdue for repeat today Urine Micro UTD?No  Current Medication Management: Diabetic Medications:Farxiga and Metformin ACEI/ARB:Yes Statin:Taking Aspirin therapy:No  Hepatic Steatosis:Has history fatty liver, had elevated LFT's checked in July, and were normal.  Anxiety  and Depression:Has been on several different options, cymbalta stopped working after taking for several years, buspar made her too sleepy. Was recommended to go to therapy but was not able to afford. She was put on Trazodone for sleep and it caused to much drowsiness the next day. She feels that her anxiety is worse than the depression. Does not have difficulty sleeping now, and denies any recent panic attacks. Today we will switch her to Effexor to help with menopausal symptoms.   Bilateral Hand Arthritis/Morning Generalized joint stiffness: She has a lot of joint stiffness first thing in the morning, her hands feel tight in the morning.  She has been on Meloxicam for years but feels it is waning in efficacy.  She notes her mother had RA and she is concerned she may have this as well.  We will switch to BID Voltaren for now and obtain labs.  Recurrent Folliculitis: She gets flare ups every now and then in the groin, neck, and leg in the past.  She applis nystatin-triamcinolone cream when she notices a spot coming up and this takes care of it, preventing things from becoming a abscess.  We will refill today.  Hot Flashes - She notes used to take premarin PO for irritability and hot flashes.  She had to come off due to cost.  She states hot flashes are intolerable - has to go outside in the mornings to cool off, has hot flashes throughout the day. We will come off of Celexa and switch to Effexor to try to help with this.   Patient Active Problem List   Diagnosis Date Noted  . Hyperlipidemia associated with type 2 diabetes mellitus ([place]) 04/27/2018  . Allergic rhinitis 04/26/2018  . Anxiety and depression 04/26/2018  . DJD (degenerative joint disease) 04/26/2018  . GERD (gastroesophageal reflux disease) 04/26/2018  . Hypertension 04/26/2018  . Migraines 04/26/2018  . Pelvic pain in female 04/26/2018  . Snoring 04/26/2018  . Tinnitus, bilateral 04/26/2018  . 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 Overweight (BMI 25.0-29.9) 04/26/2018  . Hepatic steatosis 04/13/2017  . DM (diabetes mellitus), type 2 ([place]) 04/03/2015    Past Surgical History:  Procedure Laterality Date  . ABDOMINAL HYSTERECTOMY    . APPENDECTOMY    . BREAST BIOPSY Left    neg  . CHOLECYSTECTOMY    . COLONOSCOPY WITH PROPOFOL N/A 04/15/2016   Procedure: COLONOSCOPY WITH PROPOFOL;  Surgeon: Manya Silvas, MD;  Location: [facility] ENDOSCOPY;  Service: Endoscopy;  Laterality: N/A;  . ESOPHAGOGASTRODUODENOSCOPY (EGD) WITH PROPOFOL N/A 04/15/2016   Procedure: ESOPHAGOGASTRODUODENOSCOPY (EGD) WITH PROPOFOL;  Surgeon: Manya Silvas, MD;  Location: [facility] ENDOSCOPY;  Service: Endoscopy;  Laterality: N/A;  . LUNG BIOPSY    . TONSILLECTOMY      Family History  Problem Relation Age of Onset  . Breast cancer Maternal Aunt   . Breast cancer Sister 69    Social History   Socioeconomic History  . Marital status: Married    Spouse name: Fritz Pickerel  . Number of children: 3  . Years of education: Not on file  . Highest education level: Not on file  Occupational History  . Not on file  Social Needs  . Financial resource strain: Not hard at all  . Food insecurity    Worry: Never true  Inability: Never true  . Transportation needs    Medical: No    Non-medical: No  Tobacco Use  . Smoking status: Never Smoker  . Smokeless tobacco: Never Used  Substance and Sexual Activity  . Alcohol use: No  . Drug use: No  . Sexual activity: Yes    Birth control/protection: Surgical  Lifestyle  . Physical activity    Days per week: 0 days    Minutes per session: 0 min  . Stress: Only a little  Relationships  . Social connections    Talks on phone: More than three times a week    Gets together: More than three times a week    Attends religious service: Never    Active member of club or organization: No    Attends meetings of clubs or organizations: Never    Relationship status: Married  . Intimate partner violence     Fear of current or ex partner: No    Emotionally abused: No    Physically abused: No    Forced sexual activity: No  Other Topics Concern  . Not on file  Social History Narrative  . Not on file     Current Outpatient Medications:  .  blood glucose meter kit and supplies KIT, Dispense based on patient and insurance preference. Use once daily in the mornings and 2-3 times daily PRN, Disp: 1 each, Rfl: 0 .  citalopram (CELEXA) 20 MG tablet, Take 1 tablet (20 mg total) by mouth daily., Disp: 90 tablet, Rfl: 1 .  dapagliflozin propanediol (FARXIGA) 10 MG TABS tablet, Take 1/2 tablet once daily x7 days, then increase to 1 tablet once daily, Disp: 90 tablet, Rfl: 1 .  hydrochlorothiazide (HYDRODIURIL) 12.5 MG tablet, Take 2 tablets (25 mg total) by mouth daily., Disp: 180 tablet, Rfl: 1 .  lisinopril (PRINIVIL,ZESTRIL) 40 MG tablet, Take 1 tablet (40 mg total) by mouth daily., Disp: 90 tablet, Rfl: 1 .  meloxicam (MOBIC) 7.5 MG tablet, TAKE 1 TABLET BY MOUTH DAILY AS NEEDED FOR PAIN, Disp: 30 tablet, Rfl: 1 .  metFORMIN (GLUCOPHAGE) 500 MG tablet, Take 1,000 mg by mouth 2 (two) times a day., Disp: , Rfl:  .  nystatin-triamcinolone ointment (MYCOLOG), Apply 1 application topically 2 (two) times daily., Disp: , Rfl:  .  pantoprazole (PROTONIX) 40 MG tablet, Take 1 tablet (40 mg total) by mouth daily., Disp: 90 tablet, Rfl: 1 .  potassium chloride (K-DUR,KLOR-CON) 10 MEQ tablet, Take 10 mEq by mouth daily., Disp: , Rfl:  .  rosuvastatin (CRESTOR) 10 MG tablet, Take 1 tablet (10 mg total) by mouth daily., Disp: 90 tablet, Rfl: 3 .  SUMAtriptan (IMITREX) 100 MG tablet, Take 100 mg by mouth every 2 (two) hours as needed for migraine. May repeat in 2 hours if headache persists or recurs., Disp: , Rfl:  .  tiZANidine (ZANAFLEX) 4 MG tablet, Take 0.5-1 tablets (2-4 mg total) by mouth every 8 (eight) hours as needed for muscle spasms., Disp: 30 tablet, Rfl: 0 .  valACYclovir (VALTREX) 500 MG tablet, Take 1  tablet (500 mg total) by mouth daily as needed., Disp: 90 tablet, Rfl: 1  No Known Allergies  I personally reviewed active problem list, medication list, allergies, notes from last encounter, lab results with the patient/caregiver today.   ROS  Constitutional: Negative for fever or weight change.  Respiratory: Negative for cough and shortness of breath.   Cardiovascular: Negative for chest pain or palpitations.  Gastrointestinal: Negative for abdominal pain, no bowel  changes.  Musculoskeletal: Negative for gait problem or joint swelling.  See HPI Skin: Negative for rash.  Neurological: Negative for dizziness or headache.  No other specific complaints in a complete review of systems (except as listed in HPI above).   Objective  Virtual encounter, vitals not obtained.  There is no height or weight on file to calculate BMI.  Physical Exam  Pulmonary/Chest: Effort normal. No respiratory distress. Speaking in complete sentences Neurological: Pt is alert and oriented to person, place, and time. Speech is normal Psychiatric: Patient has a normal mood and affect. behavior is normal. Judgment and thought content normal.    No results found for this or any previous visit (from the past 72 hour(s)).  PHQ2/9: Depression screen [facility] 2/9 11/02/2018 07/27/2018 04/26/2018  Decreased Interest 0 0 2  Down, Depressed, Hopeless 0 0 1  PHQ - 2 Score 0 0 3  Altered sleeping 0 0 1  Tired, decreased energy 0 0 2  Change in appetite 0 0 1  Feeling bad or failure about yourself  0 0 1  Trouble concentrating 0 0 1  Moving slowly or fidgety/restless 0 0 1  Suicidal thoughts 0 0 1  PHQ-9 Score 0 0 11  Difficult doing work/chores Not difficult at all Not difficult at all Somewhat difficult   PHQ-2/9 Result is negative.    Fall Risk: Fall Risk  11/02/2018 07/27/2018 04/26/2018  Falls in the past year? 0 0 0  Number falls in past yr: 0 0 0  Injury with Fall? 0 0 0  Follow up - Falls evaluation  completed Falls evaluation completed    Assessment & Plan  1. Essential hypertension - Stable - lisinopril (ZESTRIL) 40 MG tablet; Take 1 tablet (40 mg total) by mouth daily.  Dispense: 90 tablet; Refill: 1  2. Overweight (BMI 25.0-29.9) - Discussed importance of 150 minutes of physical activity weekly, eat two servings of fish weekly, eat one serving of tree nuts ( cashews, pistachios, pecans, almonds.Marland Kitchen) every other day, eat 6 servings of fruit/vegetables daily and drink plenty of water and avoid sweet beverages.   3. Hyperlipidemia associated with type 2 diabetes mellitus ([place]) - Continue statin therapy - dapagliflozin propanediol (FARXIGA) 10 MG TABS tablet; Take 1/2 tablet once daily x7 days, then increase to 1 tablet once daily  Dispense: 90 tablet; Refill: 1  4. Sarcoidosis of lung ([place]) - Doing well on PRN albuterol only  5. Gastroesophageal reflux disease without esophagitis - Add famotidine QHS, continue protonix in the mornings; Tums/rolaids PRN - famotidine (PEPCID) 40 MG tablet; Take 1 tablet (40 mg total) by mouth at bedtime.  Dispense: 90 tablet; Refill: 1  6. Type 2 diabetes mellitus with hyperglycemia, without long-term current use of insulin (HCC) - Hemoglobin A1c - lisinopril (ZESTRIL) 40 MG tablet; Take 1 tablet (40 mg total) by mouth daily.  Dispense: 90 tablet; Refill: 1 - dapagliflozin propanediol (FARXIGA) 10 MG TABS tablet; Take 1/2 tablet once daily x7 days, then increase to 1 tablet once daily  Dispense: 90 tablet; Refill: 1 - metFORMIN (GLUCOPHAGE) 500 MG tablet; Take 2 tablets (1,000 mg total) by mouth 2 (two) times daily with a meal.  Dispense: 360 tablet; Refill: 1  7. Arthritis pain, hand - diclofenac (VOLTAREN) 75 MG EC tablet; Take 1 tablet (75 mg total) by mouth 2 (two) times daily as needed.  Dispense: 60 tablet; Refill: 1 - ANA,IFA RA Diag Pnl w/rflx Tit/Patn - C-reactive protein - Sedimentation rate  8. Morning joint stiffness -  diclofenac  (VOLTAREN) 75 MG EC tablet; Take 1 tablet (75 mg total) by mouth 2 (two) times daily as needed.  Dispense: 60 tablet; Refill: 1 - ANA,IFA RA Diag Pnl w/rflx Tit/Patn - C-reactive protein - Sedimentation rate  9. Folliculitis - nystatin-triamcinolone ointment (MYCOLOG); Apply 1 application topically 2 (two) times daily.  Dispense: 30 g; Refill: 2  10. Anxiety and depression - Switch off Citalopram and onto Effexor per instructions. - venlafaxine XR (EFFEXOR XR) 37.5 MG 24 hr capsule; Take 1 tablet once daily of Venlafaxine and take Celexa 1 tablet once daily for 7 days, then increase to 2 tablets once daily Venlafaxine and 1/2 tablet Celexa once daily for 7 days. Then STOP Celexa and continue 2 tablets once daily of Venlafaxine.  Dispense: 60 capsule; Refill: 2  11. Hot flashes due to menopause - venlafaxine XR (EFFEXOR XR) 37.5 MG 24 hr capsule; Take 1 tablet once daily of Venlafaxine and take Celexa 1 tablet once daily for 7 days, then increase to 2 tablets once daily Venlafaxine and 1/2 tablet Celexa once daily for 7 days. Then STOP Celexa and continue 2 tablets once daily of Venlafaxine.  Dispense: 60 capsule; Refill: 2   I discussed the assessment and treatment plan with the patient. The patient was provided an opportunity to ask questions and all were answered. The patient agreed with the plan and demonstrated an understanding of the instructions.   The patient was advised to call back or seek an in-person evaluation if the symptoms worsen or if the condition fails to improve as anticipated.  I provided 26 minutes of non-face-to-face time during this encounter.  Hubbard Hartshorn, FNP

## 2018-11-07 LAB — ANA,IFA RA DIAG PNL W/RFLX TIT/PATN
Anti Nuclear Antibody (ANA): NEGATIVE
Cyclic Citrullin Peptide Ab: <16 UNITS
Rheumatoid fact SerPl-aCnc: <14 IU/mL (ref <14)

## 2018-11-07 LAB — HEMOGLOBIN A1C
Hgb A1c MFr Bld: 7.5 % of total Hgb — ABNORMAL HIGH (ref <5.7)
Mean Plasma Glucose: 169 (calc)
eAG (mmol/L): 9.3 (calc)

## 2018-11-07 LAB — SEDIMENTATION RATE: Sed Rate: 9 mm/h (ref 0–30)

## 2018-11-07 LAB — C-REACTIVE PROTEIN: CRP: 2.5 mg/L (ref <8.0)

## 2018-11-10 ENCOUNTER — Encounter: Payer: Self-pay | Admitting: Family Medicine

## 2018-11-10 DIAGNOSIS — K219 Gastro-esophageal reflux disease without esophagitis: Secondary | ICD-10-CM

## 2018-11-14 ENCOUNTER — Ambulatory Visit
Admission: EM | Admit: 2018-11-14 | Discharge: 2018-11-14 | Disposition: A | Discharge status: Home / Self Care | Payer: BLUE CROSS/BLUE SHIELD | Attending: Family Medicine | Admitting: Family Medicine

## 2018-11-14 ENCOUNTER — Other Ambulatory Visit: Payer: Self-pay

## 2018-11-14 DIAGNOSIS — B349 Viral infection, unspecified: Secondary | ICD-10-CM

## 2018-11-14 DIAGNOSIS — M791 Myalgia, unspecified site: Secondary | ICD-10-CM | POA: Diagnosis not present

## 2018-11-14 NOTE — ED Triage Notes (Addendum)
Generalized body aches starting Friday night. No fever. Pain 6/10. Had nausea and diarrhea Friday but none since.

## 2018-11-14 NOTE — Discharge Instructions (Signed)
Over the counter medications as needed, fluids, rest

## 2018-11-14 NOTE — ED Provider Notes (Signed)
[facility]    CSN: [id number] Arrival date & time: 11/14/18  5701      History   Chief Complaint Chief Complaint  Patient presents with  . Generalized Body Aches    HPI Alexis Mccarthy is a 58 y.o. female.   58 yo female with a c/o body aches for the last 3 days. Denies any fevers, chills, cough, chest pains, shortness of breath. States she had nausea and diarrhea 3 days ago as well however those symptoms have resolved now. No suspicious foods or known sick contacts.      Past Medical History:  Diagnosis Date  . Anxiety   . Arthritis   . Depression   . GERD (gastroesophageal reflux disease)   . Hypercholesterolemia   . Hypertension   . Migraine   . Sarcoidosis of lung [facility])     Patient Active Problem List   Diagnosis Date Noted  . Arthritis pain, hand 11/02/2018  . Hot flashes due to menopause 11/02/2018  . Hyperlipidemia associated with type 2 diabetes mellitus ([place]) 04/27/2018  . Allergic rhinitis 04/26/2018  . Anxiety and depression 04/26/2018  . DJD (degenerative joint disease) 04/26/2018  . GERD (gastroesophageal reflux disease) 04/26/2018  . Hypertension 04/26/2018  . Migraines 04/26/2018  . Pelvic pain in female 04/26/2018  . Snoring 04/26/2018  . Tinnitus, bilateral 04/26/2018  . 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 Overweight (BMI 25.0-29.9) 04/26/2018  . Hepatic steatosis 04/13/2017  . DM (diabetes mellitus), type 2 ([place]) 04/03/2015    Past Surgical History:  Procedure Laterality Date  . ABDOMINAL HYSTERECTOMY    . APPENDECTOMY    . BREAST BIOPSY Left    neg  . CHOLECYSTECTOMY    . COLONOSCOPY WITH PROPOFOL N/A 04/15/2016   Procedure: COLONOSCOPY WITH PROPOFOL;  Surgeon: Manya Silvas, MD;  Location: [facility] ENDOSCOPY;  Service: Endoscopy;  Laterality: N/A;  . ESOPHAGOGASTRODUODENOSCOPY (EGD) WITH PROPOFOL N/A 04/15/2016   Procedure: ESOPHAGOGASTRODUODENOSCOPY (EGD) WITH PROPOFOL;  Surgeon: Manya Silvas, MD;   Location: [facility] ENDOSCOPY;  Service: Endoscopy;  Laterality: N/A;  . LUNG BIOPSY    . TONSILLECTOMY      OB History   No obstetric history on file.      Home Medications    Prior to Admission medications   Medication Sig Start Date End Date Taking? Authorizing Provider  blood glucose meter kit and supplies KIT Dispense based on patient and insurance preference. Use once daily in the mornings and 2-3 times daily PRN 05/05/18   Hubbard Hartshorn, FNP  dapagliflozin propanediol (FARXIGA) 10 MG TABS tablet Take 1/2 tablet once daily x7 days, then increase to 1 tablet once daily 11/02/18   Hubbard Hartshorn, FNP  diclofenac (VOLTAREN) 75 MG EC tablet Take 1 tablet (75 mg total) by mouth 2 (two) times daily as needed. 11/02/18   Hubbard Hartshorn, FNP  famotidine (PEPCID) 40 MG tablet Take 1 tablet (40 mg total) by mouth at bedtime. 11/02/18   Hubbard Hartshorn, FNP  hydrochlorothiazide (HYDRODIURIL) 12.5 MG tablet Take 2 tablets (25 mg total) by mouth daily. 10/18/18   Hubbard Hartshorn, FNP  lisinopril (ZESTRIL) 40 MG tablet Take 1 tablet (40 mg total) by mouth daily. 11/02/18   Hubbard Hartshorn, FNP  metFORMIN (GLUCOPHAGE) 500 MG tablet Take 2 tablets (1,000 mg total) by mouth 2 (two) times daily with a meal. 11/02/18   Hubbard Hartshorn, FNP  nystatin-triamcinolone ointment [facility]) Apply 1 application topically 2 (two) times daily.  11/02/18   Hubbard Hartshorn, FNP  pantoprazole (PROTONIX) 40 MG tablet Take 1 tablet (40 mg total) by mouth daily. 08/29/18   Hubbard Hartshorn, FNP  potassium chloride (K-DUR,KLOR-CON) 10 MEQ tablet Take 10 mEq by mouth daily.    [provider]  rosuvastatin (CRESTOR) 10 MG tablet Take 1 tablet (10 mg total) by mouth daily. 04/27/18   Hubbard Hartshorn, FNP  SUMAtriptan (IMITREX) 100 MG tablet Take 100 mg by mouth every 2 (two) hours as needed for migraine. May repeat in 2 hours if headache persists or recurs.    [provider]  tiZANidine (ZANAFLEX) 4 MG tablet Take 0.5-1 tablets  (2-4 mg total) by mouth every 8 (eight) hours as needed for muscle spasms. 10/13/18   Hubbard Hartshorn, FNP  valACYclovir (VALTREX) 500 MG tablet Take 1 tablet (500 mg total) by mouth daily as needed. 10/27/18   Hubbard Hartshorn, FNP  venlafaxine XR (EFFEXOR XR) 37.5 MG 24 hr capsule Take 1 tablet once daily of Venlafaxine and take Celexa 1 tablet once daily for 7 days, then increase to 2 tablets once daily Venlafaxine and 1/2 tablet Celexa once daily for 7 days. Then STOP Celexa and continue 2 tablets once daily of Venlafaxine. 11/02/18   Hubbard Hartshorn, FNP    Family History Family History  Problem Relation Age of Onset  . Breast cancer Maternal Aunt   . Breast cancer Sister 19    Social History Social History   Tobacco Use  . Smoking status: Never Smoker  . Smokeless tobacco: Never Used  Substance Use Topics  . Alcohol use: No  . Drug use: No     Allergies   Patient has no known allergies.   Review of Systems Review of Systems   Physical Exam Triage Vital Signs ED Triage Vitals  Enc Vitals Group     BP 11/14/18 0918 112/68     Pulse Rate 11/14/18 0918 89     Resp 11/14/18 0918 19     Temp 11/14/18 0918 98.8 F (37.1 C)     Temp Source 11/14/18 0918 Oral     SpO2 11/14/18 0918 97 %     Weight 11/14/18 0919 160 lb (72.6 kg)     Height 11/14/18 0919 5' 6"  (1.676 m)     Head Circumference --      Peak Flow --      Pain Score 11/14/18 0919 6     Pain Loc --      Pain Edu? --      Excl. in [place]? --    No data found.  Updated Vital Signs BP 112/68 (BP Location: Left Arm)   Pulse 89   Temp 98.8 F (37.1 C) (Oral)   Resp 19   Ht 5' 6"  (1.676 m)   Wt 72.6 kg   SpO2 97%   BMI 25.82 kg/m   Visual Acuity Right Eye Distance:   Left Eye Distance:   Bilateral Distance:    Right Eye Near:   Left Eye Near:    Bilateral Near:     Physical Exam Vitals signs and nursing note reviewed.  Constitutional:      General: She is not in acute distress.    Appearance: She  is not toxic-appearing or diaphoretic.  HENT:     Mouth/Throat:     Pharynx: No posterior oropharyngeal erythema.  Cardiovascular:     Rate and Rhythm: Normal rate and regular rhythm.  Pulmonary:  Effort: Pulmonary effort is normal. No respiratory distress.     Breath sounds: Normal breath sounds. No stridor. No wheezing, rhonchi or rales.  Abdominal:     General: Bowel sounds are normal. There is no distension.     Palpations: Abdomen is soft.  Neurological:     Mental Status: She is alert.      UC Treatments / Results  Labs (all labs ordered are listed, but only abnormal results are displayed) Labs Reviewed  NOVEL CORONAVIRUS, NAA (HOSP ORDER, SEND-OUT TO REF LAB; TAT 18-24 HRS)    EKG   Radiology No results found.  Procedures Procedures (including critical care time)  Medications Ordered in UC Medications - No data to display  Initial Impression / Assessment and Plan / UC Course  I have reviewed the triage vital signs and the nursing notes.  Pertinent labs & imaging results that were available during my care of the patient were reviewed by me and considered in my medical decision making (see chart for details).      Final Clinical Impressions(s) / UC Diagnoses   Final diagnoses:  Viral syndrome     Discharge Instructions     Over the counter medications as needed, fluids, rest    ED Prescriptions    None      1.  diagnosis reviewed with patient 2. Recommend supportive treatment as above 3. covid test done 4. Follow-up prn if symptoms worsen or don't improve  PDMP not reviewed this encounter.   Norval Gable, MD 11/14/18 1018

## 2018-11-15 ENCOUNTER — Telehealth: Payer: Self-pay | Admitting: Family Medicine

## 2018-11-15 ENCOUNTER — Encounter (HOSPITAL_COMMUNITY): Payer: Self-pay

## 2018-11-15 LAB — NOVEL CORONAVIRUS, NAA (HOSP ORDER, SEND-OUT TO REF LAB; TAT 18-24 HRS): SARS-CoV-2, NAA: DETECTED — AB

## 2018-11-15 NOTE — Telephone Encounter (Signed)
Agree with documentation that is reviewed in detail.

## 2018-11-15 NOTE — Telephone Encounter (Signed)
Reached out to patient and she stated she is nauseated and congested but did not need anything called in right now. Patient was informed to make sure she stay in for 14 days

## 2018-11-15 NOTE — Telephone Encounter (Signed)
Pt had sent in a Access Nurse after rhours call saying that she has fever 100.1, cough body aches, with chills nasal congestion. I called her at 7:23am offering her the only appt avail with Raquel Sarna at 2 on 11-15-2018 and she said that she went to urgent care and they did a covid test but theythink that she has a virus. She said that she would just wait that she thought she felt a little better.

## 2018-11-15 NOTE — Telephone Encounter (Signed)
Patient called after seeing in [facility] Chart her positive COVID-19 result.  It was reinforced to patient that her test was positive and that she could spread the germ to others. Patient states her test was done at [facility] yesterday for aches and pains she had suffered for several days. Symptoms tier and criteria for ending isolation were reviewed. Good preventative practices were discussed. Patient verbalized understanding.  Office was notified. Gloriann Loan states that she will send information to [name] NP.  Note will be routed as well per office request. [place] will be notified.

## 2018-11-15 NOTE — Telephone Encounter (Signed)
The call center called in and wanted me to inform you that Alexis Mccarthy went to urgent care on yesterday and was tested positive for covid

## 2018-11-15 NOTE — Telephone Encounter (Signed)
Emily notified.

## 2018-11-18 ENCOUNTER — Encounter: Payer: Self-pay | Admitting: Family Medicine

## 2018-11-18 DIAGNOSIS — I10 Essential (primary) hypertension: Secondary | ICD-10-CM

## 2018-11-18 DIAGNOSIS — F329 Major depressive disorder, single episode, unspecified: Secondary | ICD-10-CM

## 2018-11-18 DIAGNOSIS — F32A Depression, unspecified: Secondary | ICD-10-CM

## 2018-11-21 ENCOUNTER — Ambulatory Visit (INDEPENDENT_AMBULATORY_CARE_PROVIDER_SITE_OTHER): Payer: Self-pay | Admitting: Family Medicine

## 2018-11-21 ENCOUNTER — Other Ambulatory Visit: Payer: Self-pay

## 2018-11-21 ENCOUNTER — Encounter: Payer: Self-pay | Admitting: Family Medicine

## 2018-11-21 VITALS — Temp 101.0°F

## 2018-11-21 DIAGNOSIS — D860 Sarcoidosis of lung: Secondary | ICD-10-CM

## 2018-11-21 DIAGNOSIS — U071 COVID-19: Secondary | ICD-10-CM

## 2018-11-21 DIAGNOSIS — E1165 Type 2 diabetes mellitus with hyperglycemia: Secondary | ICD-10-CM

## 2018-11-21 MED ORDER — PROMETHAZINE-DM 6.25-15 MG/5ML PO SYRP: 5.0000 mL | ORAL_SOLUTION | Freq: Four times a day (QID) | ORAL | 0 refills | Status: DC | PRN

## 2018-11-21 MED ORDER — AZITHROMYCIN 250 MG PO TABS: ORAL_TABLET | ORAL | 0 refills | Status: DC

## 2018-11-21 MED ORDER — CITALOPRAM HYDROBROMIDE 20 MG PO TABS: 20.0000 mg | ORAL_TABLET | Freq: Every day | ORAL | 1 refills | Status: DC

## 2018-11-21 MED ORDER — POTASSIUM CHLORIDE CRYS ER 10 MEQ PO TBCR: 10.0000 meq | EXTENDED_RELEASE_TABLET | Freq: Every day | ORAL | 1 refills | Status: DC

## 2018-11-21 MED ORDER — DEXAMETHASONE 6 MG PO TABS: 6.0000 mg | ORAL_TABLET | Freq: Two times a day (BID) | ORAL | 0 refills | Status: DC

## 2018-11-21 NOTE — Progress Notes (Signed)
Name: Alexis Mccarthy   MRN: [id number]    DOB: 1960/09/04   Date:11/21/2018       Progress Note  Subjective  Chief Complaint  Chief Complaint  Patient presents with   covid    getting worst, bodyache,fever,congested,cough,gagging    I connected with  Angelique Blonder on 11/21/18 at 10:00 AM EDT by telephone and verified that I am speaking with the correct person using two identifiers.   I discussed the limitations, risks, security and privacy concerns of performing an evaluation and management service by telephone and the availability of in person appointments. Staff also discussed with the patient that there may be a patient responsible charge related to this service. Patient Location: Home Provider Location: Office Additional Individuals present: None  HPI  Pt presents today to discuss her ongoing COVID-19 illness. She is on day 10 of symptoms.  She is now running a fever around 101F.  She is having very thick phlegm that causes her to choke when she coughs, shortness of breath (observed on the phone, though she denies), body aches, and fatigue.  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  She denies nausea, vomiting, or diarrhea.  Has tried, dayquil, nyquil, robitussin, tylenol, ibuprofen, and albuterol inhaler.  DM: she has DM, last A1C was uncontrolled at 7.5%.  She is still taking her medications, but not checking her BG's at home.  She denies polyphagia or polyuria or polydipsia. Discussed potential for hyperglycemia with steroid treatment, how DM plays a role in potential for severe disease, and monitoring her BG's for sick day care.  Patient Active Problem List   Diagnosis Date Noted   Arthritis pain, hand 11/02/2018   Hot flashes due to menopause 11/02/2018   Hyperlipidemia associated with type 2 diabetes mellitus ([place]) 04/27/2018   Allergic rhinitis 04/26/2018   Anxiety and depression 04/26/2018   DJD (degenerative joint disease) 04/26/2018   GERD  (gastroesophageal reflux disease) 04/26/2018   Hypertension 04/26/2018   Migraines 04/26/2018   Pelvic pain in female 04/26/2018   Snoring 04/26/2018   Tinnitus, bilateral 04/26/2018   Sarcoidosis of lung ([place]) 04/26/2018   Overweight (BMI 25.0-29.9) 04/26/2018   Hepatic steatosis 04/13/2017   DM (diabetes mellitus), type 2 ([place]) 04/03/2015    Social History   Tobacco Use   Smoking status: Never Smoker   Smokeless tobacco: Never Used  Substance Use Topics   Alcohol use: No     Current Outpatient Medications:    blood glucose meter kit and supplies KIT, Dispense based on patient and insurance preference. Use once daily in the mornings and 2-3 times daily PRN, Disp: 1 each, Rfl: 0   citalopram (CELEXA) 20 MG tablet, Take 1 tablet (20 mg total) by mouth daily., Disp: 90 tablet, Rfl: 1   dapagliflozin propanediol (FARXIGA) 10 MG TABS tablet, Take 1/2 tablet once daily x7 days, then increase to 1 tablet once daily, Disp: 90 tablet, Rfl: 1   diclofenac (VOLTAREN) 75 MG EC tablet, Take 1 tablet (75 mg total) by mouth 2 (two) times daily as needed., Disp: 60 tablet, Rfl: 1   famotidine (PEPCID) 40 MG tablet, Take 1 tablet (40 mg total) by mouth at bedtime., Disp: 90 tablet, Rfl: 1   hydrochlorothiazide (HYDRODIURIL) 12.5 MG tablet, Take 2 tablets (25 mg total) by mouth daily., Disp: 180 tablet, Rfl: 1   lisinopril (ZESTRIL) 40 MG tablet, Take 1 tablet (40 mg total) by mouth daily., Disp: 90 tablet, Rfl: 1   metFORMIN (GLUCOPHAGE)  500 MG tablet, Take 2 tablets (1,000 mg total) by mouth 2 (two) times daily with a meal., Disp: 360 tablet, Rfl: 1   nystatin-triamcinolone ointment (MYCOLOG), Apply 1 application topically 2 (two) times daily., Disp: 30 g, Rfl: 2   pantoprazole (PROTONIX) 40 MG tablet, Take 1 tablet (40 mg total) by mouth daily., Disp: 90 tablet, Rfl: 1   potassium chloride (KLOR-CON) 10 MEQ tablet, Take 1 tablet (10 mEq total) by mouth daily., Disp: 90  tablet, Rfl: 1   rosuvastatin (CRESTOR) 10 MG tablet, Take 1 tablet (10 mg total) by mouth daily., Disp: 90 tablet, Rfl: 3   SUMAtriptan (IMITREX) 100 MG tablet, Take 100 mg by mouth every 2 (two) hours as needed for migraine. May repeat in 2 hours if headache persists or recurs., Disp: , Rfl:    tiZANidine (ZANAFLEX) 4 MG tablet, Take 0.5-1 tablets (2-4 mg total) by mouth every 8 (eight) hours as needed for muscle spasms., Disp: 30 tablet, Rfl: 0   valACYclovir (VALTREX) 500 MG tablet, Take 1 tablet (500 mg total) by mouth daily as needed., Disp: 90 tablet, Rfl: 1  No Known Allergies  I personally reviewed active problem list, medication list, allergies, notes from last encounter, lab results with the patient/caregiver today.  ROS  Ten systems reviewed and is negative except as mentioned in HPI  Objective  Virtual encounter, vitals not obtained.  There is no height or weight on file to calculate BMI.  Nursing Note and Vital Signs reviewed.  Physical Exam  Constitutional: Patient appears well-developed and well-nourished. No distress.  HENT: Head: Normocephalic and atraumatic.  Neck: Normal range of motion. Pulmonary/Chest: Effort mildly increased, though she denies feeling short of breath. No respiratory distress. Speaking in complete sentences Neurological: Pt is alert and oriented to person, place, and time. Speech is normal Psychiatric: Patient has a normal mood and affect. behavior is normal. Judgment and thought content normal.  No results found for this or any previous visit (from the past 72 hour(s)).  Assessment & Plan  1. COVID-19 virus infection - I feel that she sounds a bit more short of breath than baseline, however she denies feeling short of breath at this time.  I did advised that I am concerned that she is worsening rather than improving, however she wants to try outpatient therapy for 1-2 days prior to returning to the ER.  Medications as below.  She will  also participate in the Mychart monitoring program to help [place] track her symptoms.  I do feel that she would benefit from returning to the ER today for more thorough evaluation, but she declines at this time. - dexamethasone (DECADRON) 6 MG tablet; Take 1 tablet (6 mg total) by mouth 2 (two) times daily with a meal.  Dispense: 10 tablet; Refill: 0 - azithromycin (ZITHROMAX) 250 MG tablet; Take 2 tablets once daily; then take 1 tablet once daily  Dispense: 6 tablet; Refill: 0 - promethazine-dextromethorphan (PROMETHAZINE-DM) 6.25-15 MG/5ML syrup; Take 5 mLs by mouth 4 (four) times daily as needed.  Dispense: 118 mL; Refill: 0 - MyChart COVID-19 home monitoring program; Future - Temperature monitoring; Future  2. Sarcoidosis of lung ([place]) - Has albuterol on hand, will reach out if needing refill.  3. Type 2 diabetes mellitus with hyperglycemia, without long-term current use of insulin ([place]) - Sick day care is discussed in detail.  She will check BG's daily and if going >200, she will call or message on MyChart.  DM does put her at  increased risk for severe disease.  -Red flags and when to present for emergency care or RTC including fever >101.69F, chest pain, shortness of breath, new/worsening/un-resolving symptoms, reviewed with patient at time of visit. Follow up and care instructions discussed and provided in AVS. - I discussed the assessment and treatment plan with the patient. The patient was provided an opportunity to ask questions and all were answered. The patient agreed with the plan and demonstrated an understanding of the instructions.  - The patient was advised to call back or seek an in-person evaluation if the symptoms worsen or if the condition fails to improve as anticipated.  I provided 13 minutes of non-face-to-face time during this encounter.  Hubbard Hartshorn, FNP

## 2018-11-22 ENCOUNTER — Ambulatory Visit: Payer: Self-pay | Admitting: Family Medicine

## 2018-11-22 ENCOUNTER — Telehealth: Payer: Self-pay | Admitting: Family Medicine

## 2018-11-22 NOTE — Telephone Encounter (Signed)
Patient stated she is feeling better today. Think she is on the right path to recovery

## 2018-11-22 NOTE — Telephone Encounter (Signed)
Please call to check in on patient today.  How is she feeling? How is her breathing and temperature?

## 2019-01-02 ENCOUNTER — Encounter: Payer: Self-pay | Admitting: Family Medicine

## 2019-01-02 DIAGNOSIS — M19049 Primary osteoarthritis, unspecified hand: Secondary | ICD-10-CM

## 2019-01-02 MED ORDER — MELOXICAM 15 MG PO TABS: 7.5000 mg | ORAL_TABLET | Freq: Every day | ORAL | 1 refills | Status: AC | PRN

## 2019-01-12 NOTE — Telephone Encounter (Signed)
Documentation reviewed 

## 2019-01-31 ENCOUNTER — Encounter: Payer: Self-pay | Admitting: Family Medicine

## 2019-01-31 DIAGNOSIS — K219 Gastro-esophageal reflux disease without esophagitis: Secondary | ICD-10-CM

## 2019-01-31 MED ORDER — PANTOPRAZOLE SODIUM 40 MG PO TBEC: 40.0000 mg | DELAYED_RELEASE_TABLET | Freq: Every day | ORAL | 1 refills | Status: AC

## 2019-03-30 ENCOUNTER — Other Ambulatory Visit: Payer: Self-pay | Admitting: Nurse Practitioner

## 2019-03-30 DIAGNOSIS — Z1231 Encounter for screening mammogram for malignant neoplasm of breast: Secondary | ICD-10-CM

## 2019-04-14 ENCOUNTER — Other Ambulatory Visit: Payer: Self-pay | Admitting: Family Medicine

## 2019-04-14 DIAGNOSIS — I10 Essential (primary) hypertension: Secondary | ICD-10-CM

## 2019-04-27 ENCOUNTER — Other Ambulatory Visit: Payer: Self-pay | Admitting: Family Medicine

## 2019-04-27 DIAGNOSIS — E1165 Type 2 diabetes mellitus with hyperglycemia: Secondary | ICD-10-CM

## 2019-04-27 DIAGNOSIS — I10 Essential (primary) hypertension: Secondary | ICD-10-CM

## 2019-04-29 ENCOUNTER — Other Ambulatory Visit: Payer: Self-pay | Admitting: Family Medicine

## 2019-04-29 DIAGNOSIS — I10 Essential (primary) hypertension: Secondary | ICD-10-CM

## 2019-04-29 NOTE — Telephone Encounter (Signed)
Requested Prescriptions  Pending Prescriptions Disp Refills  . hydrochlorothiazide (HYDRODIURIL) 12.5 MG tablet [Pharmacy Med Name: HYDROCHLOROTHIAZIDE 12.5 MG TB] 180 tablet 0    Sig: TAKE 2 TABLETS (25 MG TOTAL) BY MOUTH DAILY.     Cardiovascular: Diuretics - Thiazide Passed - 04/29/2019  5:02 PM      Passed - Ca in normal range and within 360 days    Calcium  Date Value Ref Range Status  08/15/2018 9.7 8.6 - 10.4 mg/dL Final   Calcium, Total  Date Value Ref Range Status  03/27/2013 8.8 8.5 - 10.1 mg/dL Final         Passed - Cr in normal range and within 360 days    Creat  Date Value Ref Range Status  08/15/2018 1.05 0.50 - 1.05 mg/dL Final    Comment:    For patients >47 years of age, the reference limit for Creatinine is approximately 13% higher for people identified as African-American. .    Creatinine, Urine  Date Value Ref Range Status  04/26/2018 187 20 - 275 mg/dL Final         Passed - K in normal range and within 360 days    Potassium  Date Value Ref Range Status  08/15/2018 3.9 3.5 - 5.3 mmol/L Final  03/27/2013 3.5 3.5 - 5.1 mmol/L Final         Passed - Na in normal range and within 360 days    Sodium  Date Value Ref Range Status  08/15/2018 137 135 - 146 mmol/L Final  03/27/2013 137 136 - 145 mmol/L Final         Passed - Last BP in normal range    BP Readings from Last 1 Encounters:  11/14/18 112/68         Passed - Valid encounter within last 6 months    Recent Outpatient Visits          5 months ago COVID-19 virus infection   [place], Astrid Divine, FNP   5 months ago Essential hypertension   [place], [facility], FNP   9 months ago Essential hypertension   [place], Astrid Divine, FNP   1 year ago Essential hypertension   [place], Astrid Divine, [place]

## 2019-05-10 ENCOUNTER — Other Ambulatory Visit: Payer: Self-pay | Admitting: Family Medicine

## 2019-05-10 DIAGNOSIS — I10 Essential (primary) hypertension: Secondary | ICD-10-CM

## 2019-05-10 NOTE — Telephone Encounter (Signed)
Requested medications are due for refill today?  Yes   This RN was unable to fill RX request due to the message below.  Please review options for patient.    Requested medications are on active medication list?  Yes  Last Refill:  11/21/2018  # 90 with one refill  Future visit scheduled?  No  Notes to Clinic:    Why Am I Seeing These Alternatives?   KLOR-CON M10 10 MEQ tablet [Pharmacy Med Name: KLOR-CON M10 TABLET] is not on the preferred formulary for the patient's insurance plan. Below are alternatives which are likely to be more affordable. Do not assume that every medication presented is a clinically appropriate alternative.  These alternatives are medications that are in the same pharmaceutical subclass (Potassium) as the ordered medication and are on formulary for the patient's insurance plan.

## 2019-05-14 ENCOUNTER — Other Ambulatory Visit: Payer: Self-pay | Admitting: Family Medicine

## 2019-05-14 DIAGNOSIS — E1165 Type 2 diabetes mellitus with hyperglycemia: Secondary | ICD-10-CM

## 2019-05-14 DIAGNOSIS — I10 Essential (primary) hypertension: Secondary | ICD-10-CM

## 2019-07-20 ENCOUNTER — Ambulatory Visit
Admission: RE | Admit: 2019-07-20 | Discharge: 2019-07-20 | Disposition: A | Discharge status: Home / Self Care | Payer: 59 | Source: Ambulatory Visit | Attending: Nurse Practitioner | Admitting: Nurse Practitioner

## 2019-07-20 ENCOUNTER — Other Ambulatory Visit: Payer: Self-pay

## 2019-07-20 DIAGNOSIS — Z1231 Encounter for screening mammogram for malignant neoplasm of breast: Secondary | ICD-10-CM | POA: Diagnosis not present

## 2020-09-13 ENCOUNTER — Other Ambulatory Visit: Payer: Self-pay | Admitting: Nurse Practitioner

## 2020-09-13 DIAGNOSIS — Z1231 Encounter for screening mammogram for malignant neoplasm of breast: Secondary | ICD-10-CM

## 2020-09-30 ENCOUNTER — Other Ambulatory Visit: Payer: Self-pay

## 2020-09-30 ENCOUNTER — Ambulatory Visit
Admission: RE | Admit: 2020-09-30 | Discharge: 2020-09-30 | Disposition: A | Discharge status: Home / Self Care | Payer: 59 | Source: Ambulatory Visit | Attending: Nurse Practitioner | Admitting: Nurse Practitioner

## 2020-09-30 DIAGNOSIS — Z1231 Encounter for screening mammogram for malignant neoplasm of breast: Secondary | ICD-10-CM | POA: Diagnosis present

## 2020-10-08 ENCOUNTER — Other Ambulatory Visit: Payer: Self-pay | Admitting: Physician Assistant

## 2020-10-08 DIAGNOSIS — M2392 Unspecified internal derangement of left knee: Secondary | ICD-10-CM

## 2020-10-15 ENCOUNTER — Ambulatory Visit
Admission: RE | Admit: 2020-10-15 | Discharge: 2020-10-15 | Disposition: A | Discharge status: Home / Self Care | Payer: 59 | Source: Ambulatory Visit | Attending: Physician Assistant | Admitting: Physician Assistant

## 2020-10-15 ENCOUNTER — Other Ambulatory Visit: Payer: Self-pay

## 2020-10-15 DIAGNOSIS — M2392 Unspecified internal derangement of left knee: Secondary | ICD-10-CM | POA: Insufficient documentation

## 2021-05-15 ENCOUNTER — Other Ambulatory Visit: Payer: Self-pay | Admitting: Nurse Practitioner

## 2021-05-15 DIAGNOSIS — Z1231 Encounter for screening mammogram for malignant neoplasm of breast: Secondary | ICD-10-CM

## 2021-10-01 ENCOUNTER — Ambulatory Visit
Admission: RE | Admit: 2021-10-01 | Discharge: 2021-10-01 | Disposition: A | Discharge status: Home / Self Care | Payer: 59 | Source: Ambulatory Visit | Attending: Nurse Practitioner | Admitting: Nurse Practitioner

## 2021-10-01 DIAGNOSIS — Z1231 Encounter for screening mammogram for malignant neoplasm of breast: Secondary | ICD-10-CM | POA: Diagnosis present

## 2021-10-19 ENCOUNTER — Ambulatory Visit
Admission: EM | Admit: 2021-10-19 | Discharge: 2021-10-19 | Disposition: A | Discharge status: Home / Self Care | Payer: 59 | Attending: Family Medicine | Admitting: Family Medicine

## 2021-10-19 ENCOUNTER — Encounter: Payer: Self-pay | Admitting: Emergency Medicine

## 2021-10-19 DIAGNOSIS — J209 Acute bronchitis, unspecified: Secondary | ICD-10-CM

## 2021-10-19 MED ORDER — PROMETHAZINE-DM 6.25-15 MG/5ML PO SYRP: 5.0000 mL | ORAL_SOLUTION | Freq: Four times a day (QID) | ORAL | 0 refills | Status: AC | PRN

## 2021-10-19 MED ORDER — DOXYCYCLINE HYCLATE 100 MG PO TABS: 100.0000 mg | ORAL_TABLET | Freq: Two times a day (BID) | ORAL | 0 refills | Status: AC

## 2021-10-19 NOTE — ED Provider Notes (Signed)
[facility]    CSN: [id number] Arrival date & time: 10/19/21  0920      History   Chief Complaint Chief Complaint  Patient presents with   Cough    HPI 61-year-old female with a history of sarcoidosis presents with respiratory symptoms.  Patient has a 3-week history of cough and chest congestion.  She reports her cough is productive of green sputum.  She also reports some nasal congestion as well as watery eyes.  Denies fever.  She states that her sarcoidosis has been in remission.  This is reflected in the EMR.  She is not on any current corticosteroids regarding sarcoidosis.  She states that he has tried numerous over-the-counter remedies without relief.  No known exacerbating factors.  Past Medical History:  Diagnosis Date   Anxiety    Arthritis    Depression    GERD (gastroesophageal reflux disease)    Hypercholesterolemia    Hypertension    Migraine    Sarcoidosis of lung ([facility])     Patient Active Problem List   Diagnosis Date Noted   Arthritis pain, hand 11/02/2018   Hot flashes due to menopause 11/02/2018   Hyperlipidemia associated with type 2 diabetes mellitus ([facility]) 04/27/2018   Allergic rhinitis 04/26/2018   Anxiety and depression 04/26/2018   DJD (degenerative joint disease) 04/26/2018   GERD (gastroesophageal reflux disease) 04/26/2018   Hypertension 04/26/2018   Migraines 04/26/2018   Pelvic pain in female 04/26/2018   Snoring 04/26/2018   Tinnitus, bilateral 04/26/2018   Sarcoidosis of lung ([facility]) 04/26/2018   Overweight (BMI 25.0-29.9) 04/26/2018   Hepatic steatosis 04/13/2017   DM (diabetes mellitus), type 2 ([facility]) 04/03/2015    Past Surgical History:  Procedure Laterality Date   ABDOMINAL HYSTERECTOMY     APPENDECTOMY     BREAST BIOPSY Left    neg   CHOLECYSTECTOMY     COLONOSCOPY WITH PROPOFOL N/A 04/15/2016   Procedure: COLONOSCOPY WITH PROPOFOL;  Surgeon: Robert T Elliott, MD;  Location: [facility];  Service: Endoscopy;   Laterality: N/A;   ESOPHAGOGASTRODUODENOSCOPY (EGD) WITH PROPOFOL N/A 04/15/2016   Procedure: ESOPHAGOGASTRODUODENOSCOPY (EGD) WITH PROPOFOL;  Surgeon: Robert T Elliott, MD;  Location: [facility];  Service: Endoscopy;  Laterality: N/A;   LUNG BIOPSY     TONSILLECTOMY      OB History   No obstetric history on file.      Home Medications    Prior to Admission medications   Medication Sig Start Date End Date Taking? Authorizing Provider  busPIRone (BUSPAR) 5 MG tablet Take 5 mg by mouth 2 (two) times daily. 10/15/21  Yes [provider]  doxycycline (VIBRA-TABS) 100 MG tablet Take 1 tablet (100 mg total) by mouth 2 (two) times daily. 10/19/21  Yes Cook, Jayce G, DO  DULoxetine (CYMBALTA) 60 MG capsule Take 60 mg by mouth daily. 09/26/21  Yes [provider]  hydrochlorothiazide (HYDRODIURIL) 12.5 MG tablet TAKE 2 TABLETS (25 MG TOTAL) BY MOUTH DAILY. 04/29/19  Yes Boyce, Emily E, FNP  KLOR-CON M10 10 MEQ tablet TAKE 1 TABLET BY MOUTH EVERY DAY 05/11/19  Yes Tapia, Leisa, PA-C  lisinopril (ZESTRIL) 40 MG tablet TAKE 1 TABLET BY MOUTH EVERY DAY 04/27/19  Yes Boyce, Emily E, FNP  metFORMIN (GLUCOPHAGE) 500 MG tablet Take 2 tablets (1,000 mg total) by mouth 2 (two) times daily with a meal. 11/02/18  Yes Boyce, Emily E, FNP  pantoprazole (PROTONIX) 40 MG tablet Take 1 tablet (40 mg total) by mouth daily. 01/31/19    Yes Hubbard Hartshorn, FNP  promethazine-dextromethorphan (PROMETHAZINE-DM) 6.25-15 MG/5ML syrup Take 5 mLs by mouth 4 (four) times daily as needed for cough. 10/19/21  Yes Imya Mance G, DO  rosuvastatin (CRESTOR) 10 MG tablet Take 1 tablet (10 mg total) by mouth daily. 04/27/18  Yes Hubbard Hartshorn, FNP  valACYclovir (VALTREX) 500 MG tablet TAKE 1 TABLET BY MOUTH DAILY AS NEEDED. 04/27/19  Yes Hubbard Hartshorn, FNP  blood glucose meter kit and supplies KIT Dispense based on patient and insurance preference. Use once daily in the mornings and 2-3 times daily PRN 05/05/18   Hubbard Hartshorn, FNP  JARDIANCE 25 MG TABS tablet Take 25 mg by mouth daily. 10/06/21   [provider]  meloxicam (MOBIC) 15 MG tablet Take 0.5-1 tablets (7.5-15 mg total) by mouth daily as needed for pain. 01/02/19   Hubbard Hartshorn, FNP  nystatin-triamcinolone ointment [facility]) Apply 1 application topically 2 (two) times daily. 11/02/18   Hubbard Hartshorn, FNP  SUMAtriptan (IMITREX) 100 MG tablet Take 100 mg by mouth every 2 (two) hours as needed for migraine. May repeat in 2 hours if headache persists or recurs.    [provider]  tiZANidine (ZANAFLEX) 4 MG tablet Take 0.5-1 tablets (2-4 mg total) by mouth every 8 (eight) hours as needed for muscle spasms. 10/13/18   Hubbard Hartshorn, FNP    Family History Family History  Problem Relation Age of Onset   Breast cancer Maternal Aunt    Breast cancer Sister 28    Social History Social History   Tobacco Use   Smoking status: Never   Smokeless tobacco: Never  Vaping Use   Vaping Use: Never used  Substance Use Topics   Alcohol use: No   Drug use: No     Allergies   Patient has no known allergies.   Review of Systems Review of Systems Per HPI  Physical Exam Triage Vital Signs ED Triage Vitals  Enc Vitals Group     BP 10/19/21 0935 110/80     Pulse Rate 10/19/21 0935 94     Resp 10/19/21 0935 14     Temp 10/19/21 0935 98.1 F (36.7 C)     Temp Source 10/19/21 0935 Oral     SpO2 10/19/21 0935 99 %     Weight 10/19/21 0931 151 lb (68.5 kg)     Height 10/19/21 0931 5' 6" (1.676 m)     Head Circumference --      Peak Flow --      Pain Score 10/19/21 0931 0     Pain Loc --      Pain Edu? --      Excl. in [place]? --    Updated Vital Signs BP 110/80 (BP Location: Right Arm)   Pulse 94   Temp 98.1 F (36.7 C) (Oral)   Resp 14   Ht 5' 6" (1.676 m)   Wt 68.5 kg   SpO2 99%   BMI 24.37 kg/m   Visual Acuity Right Eye Distance:   Left Eye Distance:   Bilateral Distance:    Right Eye Near:   Left Eye Near:     Bilateral Near:     Physical Exam Vitals and nursing note reviewed.  Constitutional:      General: She is not in acute distress.    Appearance: Normal appearance.  HENT:     Head: Normocephalic and atraumatic.     Mouth/Throat:     Pharynx: Oropharynx  is clear.  Eyes:     General:        Right eye: No discharge.        Left eye: No discharge.     Conjunctiva/sclera: Conjunctivae normal.  Cardiovascular:     Rate and Rhythm: Normal rate and regular rhythm.  Pulmonary:     Effort: Pulmonary effort is normal.     Breath sounds: Normal breath sounds. No wheezing, rhonchi or rales.  Neurological:     Mental Status: She is alert.  Psychiatric:        Mood and Affect: Mood normal.        Behavior: Behavior normal.    UC Treatments / Results  Labs (all labs ordered are listed, but only abnormal results are displayed) Labs Reviewed - No data to display  EKG   Radiology No results found.  Procedures Procedures (including critical care time)  Medications Ordered in UC Medications - No data to display  Initial Impression / Assessment and Plan / UC Course  I have reviewed the triage vital signs and the nursing notes.  Pertinent labs & imaging results that were available during my care of the patient were reviewed by me and considered in my medical decision making (see chart for details).    61-year-old female presents with respiratory symptoms.  Patient's clinical picture consistent with bronchitis.  Given persistent symptoms and lack of improvement, placing empirically on doxycycline for bacterial bronchitis.  Promethazine DM for cough.  Final Clinical Impressions(s) / UC Diagnoses   Final diagnoses:  Acute bronchitis, unspecified organism   Discharge Instructions   None    ED Prescriptions     Medication Sig Dispense Auth. Provider   doxycycline (VIBRA-TABS) 100 MG tablet Take 1 tablet (100 mg total) by mouth 2 (two) times daily. 14 tablet Cook, Jayce G, DO    promethazine-dextromethorphan (PROMETHAZINE-DM) 6.25-15 MG/5ML syrup Take 5 mLs by mouth 4 (four) times daily as needed for cough. 118 mL Cook, Jayce G, DO      PDMP not reviewed this encounter.   Cook, Jayce G, DO 10/19/21 0955  

## 2021-10-19 NOTE — ED Triage Notes (Signed)
Patient c/o cough, chest congestion, watery eyes and nasal congestion that started 3 weeks.  Patient denies fevers.

## 2022-04-16 ENCOUNTER — Other Ambulatory Visit: Payer: Self-pay | Admitting: Nephrology

## 2022-04-16 DIAGNOSIS — R8290 Unspecified abnormal findings in urine: Secondary | ICD-10-CM

## 2022-04-16 DIAGNOSIS — E1122 Type 2 diabetes mellitus with diabetic chronic kidney disease: Secondary | ICD-10-CM

## 2022-04-28 ENCOUNTER — Ambulatory Visit
Admission: RE | Admit: 2022-04-28 | Discharge: 2022-04-28 | Disposition: A | Discharge status: Home / Self Care | Payer: 59 | Source: Ambulatory Visit | Attending: Nephrology | Admitting: Nephrology

## 2022-04-28 DIAGNOSIS — E1122 Type 2 diabetes mellitus with diabetic chronic kidney disease: Secondary | ICD-10-CM | POA: Insufficient documentation

## 2022-04-28 DIAGNOSIS — N1832 Chronic kidney disease, stage 3b: Secondary | ICD-10-CM | POA: Insufficient documentation

## 2022-04-28 DIAGNOSIS — R8290 Unspecified abnormal findings in urine: Secondary | ICD-10-CM | POA: Diagnosis not present

## 2022-05-07 ENCOUNTER — Encounter: Payer: Self-pay | Admitting: Gastroenterology

## 2022-05-07 NOTE — H&P (Signed)
Pre-Procedure H&P   Patient ID: Alexis Mccarthy is a 62 y.o. female.  Gastroenterology Provider: [name], DO  Referring Provider: Tawni Pummel, PA PCP: Myrene Buddy, NP  Date: 05/08/2022  HPI [name] is a 62 y.o. female who presents today for Esophagogastroduodenoscopy and Colonoscopy for Dysphagia, GERD, surveillance-personal history of colon polyps .  Patient has noted lower esophageal burning and food sticking.  She has no liquid or pill dysphagia or odynophagia.  She is increased to Protonix to twice a day. Also taking famotidine at night. This has improved her reflux and swallowing. No nausea or vomiting weight loss or appetite changes.  She last underwent EGD in March 2018 with gastritis that was negative for H. pylori on biopsies.  Fundic gland polyps were also noted.  She reports regular bowel movements without melena hematochezia diarrhea or constipation.  She last underwent colonoscopy in March 2018 demonstrating 1 adenomatous polyp and internal hemorrhoids  She is status post appendectomy and cholecystectomy  Her last dose of Mounjaro was Thursday, March 28  No family history of colon cancer or colon polyps  Most recent lab work hemoglobin 12 MCV 92.4 platelets 313,000 creatinine 1.54   Past Medical History:  Diagnosis Date   Anxiety    Arthritis    Depression    Diabetes mellitus without complication    GERD (gastroesophageal reflux disease)    Hypercholesterolemia    Hypertension    Migraine    Sarcoidosis of lung     Past Surgical History:  Procedure Laterality Date   ABDOMINAL HYSTERECTOMY     APPENDECTOMY     BREAST BIOPSY Left    neg   CHOLECYSTECTOMY     COLONOSCOPY WITH PROPOFOL N/A 04/15/2016   Procedure: COLONOSCOPY WITH PROPOFOL;  Surgeon: Scot Jun, MD;  Location: [facility] ENDOSCOPY;  Service: Endoscopy;  Laterality: N/A;   ESOPHAGOGASTRODUODENOSCOPY (EGD) WITH PROPOFOL N/A 04/15/2016    Procedure: ESOPHAGOGASTRODUODENOSCOPY (EGD) WITH PROPOFOL;  Surgeon: Scot Jun, MD;  Location: [facility] ENDOSCOPY;  Service: Endoscopy;  Laterality: N/A;   LUNG BIOPSY     TONSILLECTOMY      Family History No h/o GI disease or malignancy  Review of Systems  Constitutional:  Negative for activity change, appetite change, chills, diaphoresis, fatigue, fever and unexpected weight change.  HENT:  Positive for trouble swallowing. Negative for voice change.   Respiratory:  Negative for shortness of breath and wheezing.   Cardiovascular:  Negative for chest pain, palpitations and leg swelling.  Gastrointestinal:  Negative for abdominal distention, abdominal pain, anal bleeding, blood in stool, constipation, diarrhea, nausea, rectal pain and vomiting.       + GERD  Musculoskeletal:  Negative for arthralgias and myalgias.  Skin:  Negative for color change and pallor.  Neurological:  Negative for dizziness, syncope and weakness.  Psychiatric/Behavioral:  Negative for confusion.   All other systems reviewed and are negative.    Medications No current facility-administered medications on file prior to encounter.   Current Outpatient Medications on File Prior to Encounter  Medication Sig Dispense Refill   blood glucose meter kit and supplies KIT Dispense based on patient and insurance preference. Use once daily in the mornings and 2-3 times daily PRN 1 each 0   busPIRone (BUSPAR) 5 MG tablet Take 5 mg by mouth 2 (two) times daily.     doxycycline (VIBRA-TABS) 100 MG tablet Take 1 tablet (100 mg total) by mouth 2 (two) times daily. 14 tablet 0  DULoxetine (CYMBALTA) 60 MG capsule Take 60 mg by mouth daily.     hydrochlorothiazide (HYDRODIURIL) 12.5 MG tablet TAKE 2 TABLETS (25 MG TOTAL) BY MOUTH DAILY. 180 tablet 0   JARDIANCE 25 MG TABS tablet Take 25 mg by mouth daily.     KLOR-CON M10 10 MEQ tablet TAKE 1 TABLET BY MOUTH EVERY DAY 90 tablet 1   lisinopril (ZESTRIL) 40 MG tablet TAKE 1  TABLET BY MOUTH EVERY DAY 30 tablet 0   meloxicam (MOBIC) 15 MG tablet Take 0.5-1 tablets (7.5-15 mg total) by mouth daily as needed for pain. 90 tablet 1   metFORMIN (GLUCOPHAGE) 500 MG tablet Take 2 tablets (1,000 mg total) by mouth 2 (two) times daily with a meal. 360 tablet 1   nystatin-triamcinolone ointment (MYCOLOG) Apply 1 application topically 2 (two) times daily. 30 g 2   pantoprazole (PROTONIX) 40 MG tablet Take 1 tablet (40 mg total) by mouth daily. 90 tablet 1   promethazine-dextromethorphan (PROMETHAZINE-DM) 6.25-15 MG/5ML syrup Take 5 mLs by mouth 4 (four) times daily as needed for cough. 118 mL 0   rosuvastatin (CRESTOR) 10 MG tablet Take 1 tablet (10 mg total) by mouth daily. 90 tablet 3   tirzepatide (MOUNJARO) 5 MG/0.5ML Pen Inject 5 mg into the skin once a week.     tiZANidine (ZANAFLEX) 4 MG tablet Take 0.5-1 tablets (2-4 mg total) by mouth every 8 (eight) hours as needed for muscle spasms. 30 tablet 0   valACYclovir (VALTREX) 500 MG tablet TAKE 1 TABLET BY MOUTH DAILY AS NEEDED. 90 tablet 1   SUMAtriptan (IMITREX) 100 MG tablet Take 100 mg by mouth every 2 (two) hours as needed for migraine. May repeat in 2 hours if headache persists or recurs.      Pertinent medications related to GI and procedure were reviewed by me with the patient prior to the procedure   Current Facility-Administered Medications:    0.9 %  sodium chloride infusion, , Intravenous, Continuous, [name], DO, Last Rate: 20 mL/hr at 05/08/22 0750, 20 mL/hr at 05/08/22 0750      No Known Allergies Allergies were reviewed by me prior to the procedure  Objective   Body mass index is 24.14 kg/m. Vitals:   05/08/22 0736  BP: 110/74  Pulse: 81  Resp: 20  Temp: (!) 96 F (35.6 C)  TempSrc: Temporal  SpO2: 100%  Weight: 59.9 kg  Height: 5\' 2"  (1.575 m)     Physical Exam Vitals and nursing note reviewed.  Constitutional:      General: She is not in acute distress.    Appearance:  Normal appearance. She is not ill-appearing, toxic-appearing or diaphoretic.  HENT:     Head: Normocephalic and atraumatic.     Nose: Nose normal.     Mouth/Throat:     Mouth: Mucous membranes are moist.     Pharynx: Oropharynx is clear.  Eyes:     General: No scleral icterus.    Extraocular Movements: Extraocular movements intact.  Cardiovascular:     Rate and Rhythm: Normal rate and regular rhythm.     Heart sounds: Normal heart sounds. No murmur heard.    No friction rub. No gallop.  Pulmonary:     Effort: Pulmonary effort is normal. No respiratory distress.     Breath sounds: Normal breath sounds. No wheezing, rhonchi or rales.  Abdominal:     General: Bowel sounds are normal. There is no distension.     Palpations: Abdomen is  soft.     Tenderness: There is no abdominal tenderness. There is no guarding or rebound.  Musculoskeletal:     Cervical back: Neck supple.     Right lower leg: No edema.     Left lower leg: No edema.  Skin:    General: Skin is warm and dry.     Coloration: Skin is not jaundiced or pale.  Neurological:     General: No focal deficit present.     Mental Status: She is alert and oriented to person, place, and time. Mental status is at baseline.  Psychiatric:        Mood and Affect: Mood normal.        Behavior: Behavior normal.        Thought Content: Thought content normal.        Judgment: Judgment normal.      Assessment:  [name] is a 62 y.o. female  who presents today for Esophagogastroduodenoscopy and Colonoscopy for Dysphagia, GERD, surveillance-personal history of colon polyps .  Plan:  Esophagogastroduodenoscopy and Colonoscopy with possible intervention today  Esophagogastroduodenoscopy and Colonoscopy with possible biopsy, control of bleeding, polypectomy, and interventions as necessary has been discussed with the patient/patient representative. Informed consent was obtained from the patient/patient representative after  explaining the indication, nature, and risks of the procedure including but not limited to death, bleeding, perforation, missed neoplasm/lesions, cardiorespiratory compromise, and reaction to medications. Opportunity for questions was given and appropriate answers were provided. Patient/patient representative has verbalized understanding is amenable to undergoing the procedure.   [name], DO  [facility]  Portions of the record may have been created with voice recognition software. Occasional wrong-word or 'sound-a-like' substitutions may have occurred due to the inherent limitations of voice recognition software.  Read the chart carefully and recognize, using context, where substitutions may have occurred.

## 2022-05-08 ENCOUNTER — Ambulatory Visit
Admission: RE | Admit: 2022-05-08 | Discharge: 2022-05-08 | Disposition: A | Discharge status: Home / Self Care | Payer: 59 | Attending: Gastroenterology | Admitting: Gastroenterology

## 2022-05-08 ENCOUNTER — Encounter
Admission: RE | Disposition: A | Discharge status: Home / Self Care | Payer: Self-pay | Source: Home / Self Care | Attending: Gastroenterology

## 2022-05-08 ENCOUNTER — Encounter: Payer: Self-pay | Admitting: Gastroenterology

## 2022-05-08 ENCOUNTER — Ambulatory Visit: Payer: 59 | Admitting: Certified Registered"

## 2022-05-08 DIAGNOSIS — D122 Benign neoplasm of ascending colon: Secondary | ICD-10-CM | POA: Insufficient documentation

## 2022-05-08 DIAGNOSIS — K644 Residual hemorrhoidal skin tags: Secondary | ICD-10-CM | POA: Insufficient documentation

## 2022-05-08 DIAGNOSIS — E119 Type 2 diabetes mellitus without complications: Secondary | ICD-10-CM | POA: Insufficient documentation

## 2022-05-08 DIAGNOSIS — I10 Essential (primary) hypertension: Secondary | ICD-10-CM | POA: Insufficient documentation

## 2022-05-08 DIAGNOSIS — D123 Benign neoplasm of transverse colon: Secondary | ICD-10-CM | POA: Insufficient documentation

## 2022-05-08 DIAGNOSIS — K449 Diaphragmatic hernia without obstruction or gangrene: Secondary | ICD-10-CM | POA: Diagnosis not present

## 2022-05-08 DIAGNOSIS — Z7984 Long term (current) use of oral hypoglycemic drugs: Secondary | ICD-10-CM | POA: Diagnosis not present

## 2022-05-08 DIAGNOSIS — K317 Polyp of stomach and duodenum: Secondary | ICD-10-CM | POA: Diagnosis not present

## 2022-05-08 DIAGNOSIS — K2970 Gastritis, unspecified, without bleeding: Secondary | ICD-10-CM | POA: Insufficient documentation

## 2022-05-08 DIAGNOSIS — Z86010 Personal history of colonic polyps: Secondary | ICD-10-CM | POA: Diagnosis not present

## 2022-05-08 DIAGNOSIS — F32A Depression, unspecified: Secondary | ICD-10-CM | POA: Diagnosis not present

## 2022-05-08 DIAGNOSIS — E7800 Pure hypercholesterolemia, unspecified: Secondary | ICD-10-CM | POA: Insufficient documentation

## 2022-05-08 DIAGNOSIS — K621 Rectal polyp: Secondary | ICD-10-CM | POA: Diagnosis not present

## 2022-05-08 DIAGNOSIS — Z79899 Other long term (current) drug therapy: Secondary | ICD-10-CM | POA: Insufficient documentation

## 2022-05-08 DIAGNOSIS — R1310 Dysphagia, unspecified: Secondary | ICD-10-CM | POA: Diagnosis not present

## 2022-05-08 DIAGNOSIS — F419 Anxiety disorder, unspecified: Secondary | ICD-10-CM | POA: Insufficient documentation

## 2022-05-08 DIAGNOSIS — K5730 Diverticulosis of large intestine without perforation or abscess without bleeding: Secondary | ICD-10-CM | POA: Diagnosis not present

## 2022-05-08 DIAGNOSIS — Z1211 Encounter for screening for malignant neoplasm of colon: Secondary | ICD-10-CM | POA: Insufficient documentation

## 2022-05-08 DIAGNOSIS — K219 Gastro-esophageal reflux disease without esophagitis: Secondary | ICD-10-CM | POA: Diagnosis not present

## 2022-05-08 HISTORY — PX: ESOPHAGOGASTRODUODENOSCOPY: SHX5428

## 2022-05-08 HISTORY — PX: COLONOSCOPY: SHX5424

## 2022-05-08 HISTORY — DX: Type 2 diabetes mellitus without complications: E11.9

## 2022-05-08 LAB — GLUCOSE, CAPILLARY: Glucose-Capillary: 71 mg/dL (ref 70–99)

## 2022-05-08 SURGERY — COLONOSCOPY
Anesthesia: General

## 2022-05-08 MED ORDER — GLYCOPYRROLATE 0.2 MG/ML IJ SOLN
INTRAMUSCULAR | Status: DC | PRN
  Administered 2022-05-08: .2 mg via INTRAVENOUS

## 2022-05-08 MED ORDER — PHENYLEPHRINE HCL (PRESSORS) 10 MG/ML IV SOLN
INTRAVENOUS | Status: DC | PRN
  Administered 2022-05-08: 160 ug via INTRAVENOUS

## 2022-05-08 MED ORDER — SODIUM CHLORIDE 0.9 % IV SOLN
INTRAVENOUS | Status: DC
  Administered 2022-05-08: 20 mL/h via INTRAVENOUS

## 2022-05-08 MED ORDER — PROPOFOL 10 MG/ML IV BOLUS
INTRAVENOUS | Status: DC | PRN
  Administered 2022-05-08: 20 mg via INTRAVENOUS
  Administered 2022-05-08: 150 ug/kg/min via INTRAVENOUS
  Administered 2022-05-08 (×2): 20 mg via INTRAVENOUS

## 2022-05-08 MED ORDER — DEXMEDETOMIDINE HCL IN NACL 80 MCG/20ML IV SOLN
INTRAVENOUS | Status: DC | PRN
  Administered 2022-05-08: 12 ug via BUCCAL

## 2022-05-08 MED ORDER — LIDOCAINE HCL (CARDIAC) PF 100 MG/5ML IV SOSY
PREFILLED_SYRINGE | INTRAVENOUS | Status: DC | PRN
  Administered 2022-05-08: 30 mg via INTRAVENOUS

## 2022-05-08 NOTE — Interval H&P Note (Signed)
History and Physical Interval Note: Preprocedure H&P from 05/08/22  was reviewed and there was no interval change after seeing and examining the patient.  Written consent was obtained from the patient after discussion of risks, benefits, and alternatives. Patient has consented to proceed with Esophagogastroduodenoscopy and Colonoscopy with possible intervention   05/08/2022 8:13 AM  Alexis Mccarthy  has presented today for surgery, with the diagnosis of Gastroesophageal reflux disease, unspecified whether esophagitis present (K21.9) Dysphagia, unspecified type (R13.10) History of colon polyps (Z86.010).  The various methods of treatment have been discussed with the patient and family. After consideration of risks, benefits and other options for treatment, the patient has consented to  Procedure(s): COLONOSCOPY (N/A) ESOPHAGOGASTRODUODENOSCOPY (EGD) (N/A) as a surgical intervention.  The patient's history has been reviewed, patient examined, no change in status, stable for surgery.  I have reviewed the patient's chart and labs.  Questions were answered to the patient's satisfaction.     [name]

## 2022-05-08 NOTE — Anesthesia Postprocedure Evaluation (Signed)
Anesthesia Post Note  Patient: Milissa Swink  Procedure(s) Performed: COLONOSCOPY ESOPHAGOGASTRODUODENOSCOPY (EGD)  Patient location during evaluation: PACU Anesthesia Type: General Level of consciousness: awake and oriented Pain management: pain level controlled Vital Signs Assessment: post-procedure vital signs reviewed and stable Respiratory status: spontaneous breathing and respiratory function stable Cardiovascular status: blood pressure returned to baseline Anesthetic complications: no   There were no known notable events for this encounter.   Last Vitals:  Vitals:   05/08/22 0916 05/08/22 0927  BP: 109/68 117/76  Pulse: 77 80  Resp: 19 20  Temp:    SpO2: 99% 100%    Last Pain:  Vitals:   05/08/22 0927  TempSrc:   PainSc: 0-No pain                 VAN STAVEREN,Jamarrius Salay

## 2022-05-08 NOTE — Transfer of Care (Signed)
Immediate Anesthesia Transfer of Care Note  Patient: Alexis Mccarthy  Procedure(s) Performed: COLONOSCOPY ESOPHAGOGASTRODUODENOSCOPY (EGD)  Patient Location: PACU  Anesthesia Type:General  Level of Consciousness: awake and oriented  Airway & Oxygen Therapy: Patient Spontanous Breathing  Post-op Assessment: Report given to RN and Post -op Vital signs reviewed and stable  Post vital signs: stable  Last Vitals:  Vitals Value Taken Time  BP    Temp    Pulse    Resp    SpO2      Last Pain:  Vitals:   05/08/22 0736  TempSrc: Temporal  PainSc: 0-No pain         Complications: No notable events documented.

## 2022-05-08 NOTE — Op Note (Signed)
[facility] Gastroenterology Patient Name: Alexis Mccarthy Procedure Date: 05/08/2022 8:05 AM MRN: [id number] Account #: [id number] Date of Birth: 07/25/1960 Admit Type: Outpatient Age: 62 Room: [facility] ENDO ROOM 1 Gender: Female Note Status: Finalized Instrument Name: Colonoscope [id number] Procedure:             Colonoscopy Indications:           High risk colon cancer surveillance: Personal history                         of colonic polyps Providers:             Trenda Moots, DO Referring MD:          [name] DO, DO (Referring MD), Caryl Asp (Referring MD) Medicines:             Monitored Anesthesia Care Complications:         No immediate complications. Estimated blood loss:                         Minimal. Procedure:             Pre-Anesthesia Assessment:                        - Prior to the procedure, a History and Physical was                         performed, and patient medications and allergies were                         reviewed. The patient is competent. The risks and                         benefits of the procedure and the sedation options and                         risks were discussed with the patient. All questions                         were answered and informed consent was obtained.                         Patient identification and proposed procedure were                         verified by the physician, the nurse, the anesthetist                         and the technician in the endoscopy suite. Mental                         Status Examination: alert and oriented. Airway                         Examination: normal oropharyngeal airway and neck  mobility. Respiratory Examination: clear to                         auscultation. CV Examination: RRR, no murmurs, no S3                         or S4. Prophylactic Antibiotics: The patient does not                          require prophylactic antibiotics. Prior                         Anticoagulants: The patient has taken no anticoagulant                         or antiplatelet agents. ASA Grade Assessment: II - A                         patient with mild systemic disease. After reviewing                         the risks and benefits, the patient was deemed in                         satisfactory condition to undergo the procedure. The                         anesthesia plan was to use monitored anesthesia care                         (MAC). Immediately prior to administration of                         medications, the patient was re-assessed for adequacy                         to receive sedatives. The heart rate, respiratory                         rate, oxygen saturations, blood pressure, adequacy of                         pulmonary ventilation, and response to care were                         monitored throughout the procedure. The physical                         status of the patient was re-assessed after the                         procedure.                        After obtaining informed consent, the colonoscope was                         passed under direct vision. Throughout the procedure,  the patient's blood pressure, pulse, and oxygen                         saturations were monitored continuously. The                         Colonoscope was introduced through the anus and                         advanced to the the terminal ileum, with                         identification of the appendiceal orifice and IC                         valve. The colonoscopy was performed without                         difficulty. The patient tolerated the procedure well.                         The quality of the bowel preparation was evaluated                         using the BBPS [facility] Bowel Preparation Scale) with                         scores of: Right Colon = 2 (minor amount  of residual                         staining, small fragments of stool and/or opaque                         liquid, but mucosa seen well), Transverse Colon = 3                         (entire mucosa seen well with no residual staining,                         small fragments of stool or opaque liquid) and Left                         Colon = 3 (entire mucosa seen well with no residual                         staining, small fragments of stool or opaque liquid).                         The total BBPS score equals 8. The quality of the                         bowel preparation was excellent. The terminal ileum,                         ileocecal valve, appendiceal orifice, and rectum were  photographed. Findings:      Skin tags were found on perianal exam.      The digital rectal exam was normal. Pertinent negatives include normal       sphincter tone.      The terminal ileum appeared normal. Estimated blood loss: none.      Retroflexion in the right colon was performed.      Multiple small-mouthed diverticula were found in the recto-sigmoid colon       and sigmoid colon. Estimated blood loss: none.      Anal papilla(e) were hypertrophied. Estimated blood loss: none.      A 4 to 5 mm polyp was found in the ascending colon. The polyp was       sessile. The polyp was removed with a cold snare. Resection and       retrieval were complete. Estimated blood loss was minimal.      Four sessile polyps were found in the rectum (1), transverse colon (2)       and ascending colon (1). The polyps were 1 to 2 mm in size. These polyps       were removed with a cold biopsy forceps. Resection and retrieval were       complete. Estimated blood loss was minimal.      The exam was otherwise without abnormality on direct and retroflexion       views.      There was a small lipoma, in the ascending colon. + pillow sign. no       biopsies. Impression:            - Perianal skin tags  found on perianal exam.                        - The examined portion of the ileum was normal.                        - Diverticulosis in the recto-sigmoid colon and in the                         sigmoid colon.                        - Anal papilla(e) were hypertrophied.                        - One 4 to 5 mm polyp in the ascending colon, removed                         with a cold snare. Resected and retrieved.                        - Four 1 to 2 mm polyps in the rectum, in the                         transverse colon and in the ascending colon, removed                         with a cold biopsy forceps. Resected and retrieved.                        - The examination was otherwise  normal on direct and                         retroflexion views.                        - Small lipoma in the ascending colon. Recommendation:        - Patient has a contact number available for                         emergencies. The signs and symptoms of potential                         delayed complications were discussed with the patient.                         Return to normal activities tomorrow. Written                         discharge instructions were provided to the patient.                        - Discharge patient to home.                        - Resume previous diet.                        - Continue present medications.                        - No ibuprofen, naproxen, or other non-steroidal                         anti-inflammatory drugs for 5 days after polyp removal.                        - Await pathology results.                        - Repeat colonoscopy for surveillance based on                         pathology results.                        - Return to GI office as previously scheduled.                        - The findings and recommendations were discussed with                         the patient. Procedure Code(s):     --- Professional ---                        [id number],  Colonoscopy, flexible; with removal of                         tumor(s), polyp(s), or other lesion(s) by snare  technique                        [id number], 59, Colonoscopy, flexible; with biopsy, single                         or multiple Diagnosis Code(s):     --- Professional ---                        Z86.010, Personal history of colonic polyps                        K62.89, Other specified diseases of anus and rectum                        D12.2, Benign neoplasm of ascending colon                        D12.8, Benign neoplasm of rectum                        D12.3, Benign neoplasm of transverse colon (hepatic                         flexure or splenic flexure)                        D17.5, Benign lipomatous neoplasm of intra-abdominal                         organs                        K64.4, Residual hemorrhoidal skin tags                        K57.30, Diverticulosis of large intestine without                         perforation or abscess without bleeding CPT copyright 2022 American Medical Association. All rights reserved. The codes documented in this report are preliminary and upon coder review may  be revised to meet current compliance requirements. Attending Participation:      I personally performed the entire procedure. Elfredia NevinsSteven Catrinia Racicot, DO Jaynie CollinsSteven Michael Fredric Slabach DO, DO 05/08/2022 9:08:27 AM This report has been signed electronically. Number of Addenda: 0 Note Initiated On: 05/08/2022 8:05 AM Scope Withdrawal Time: 0 hours 18 minutes 25 seconds  Total Procedure Duration: 0 hours 25 minutes 29 seconds  Estimated Blood Loss:  Estimated blood loss was minimal.      [facility]

## 2022-05-08 NOTE — Anesthesia Preprocedure Evaluation (Signed)
Anesthesia Evaluation  Patient identified by MRN, date of birth, ID band Patient awake    Reviewed: Allergy & Precautions, NPO status , Patient's Chart, lab work & pertinent test results  Airway Mallampati: II  TM Distance: >3 FB Neck ROM: full    Dental  (+) Teeth Intact   Pulmonary neg pulmonary ROS   Pulmonary exam normal breath sounds clear to auscultation       Cardiovascular Exercise Tolerance: Good hypertension, Pt. on medications negative cardio ROS Normal cardiovascular exam Rhythm:Regular Rate:Normal     Neuro/Psych  Headaches  Anxiety     negative neurological ROS  negative psych ROS   GI/Hepatic negative GI ROS, Neg liver ROS,GERD  Medicated and Controlled,,  Endo/Other  negative endocrine ROSdiabetes, Type 2, Oral Hypoglycemic Agents    Renal/GU negative Renal ROS  negative genitourinary   Musculoskeletal  (+) Arthritis ,    Abdominal Normal abdominal exam  (+)   Peds negative pediatric ROS (+)  Hematology negative hematology ROS (+)   Anesthesia Other Findings Past Medical History: No date: Anxiety No date: Arthritis No date: Depression No date: Diabetes mellitus without complication No date: GERD (gastroesophageal reflux disease) No date: Hypercholesterolemia No date: Hypertension No date: Migraine No date: Sarcoidosis of lung  Past Surgical History: No date: ABDOMINAL HYSTERECTOMY No date: APPENDECTOMY No date: BREAST BIOPSY; Left     Comment:  neg No date: CHOLECYSTECTOMY 04/15/2016: COLONOSCOPY WITH PROPOFOL; N/A     Comment:  Procedure: COLONOSCOPY WITH PROPOFOL;  Surgeon: Scot Jun, MD;  Location: [facility] ENDOSCOPY;  Service:               Endoscopy;  Laterality: N/A; 04/15/2016: ESOPHAGOGASTRODUODENOSCOPY (EGD) WITH PROPOFOL; N/A     Comment:  Procedure: ESOPHAGOGASTRODUODENOSCOPY (EGD) WITH               PROPOFOL;  Surgeon: Scot Jun, MD;  Location: [facility]               ENDOSCOPY;  Service: Endoscopy;  Laterality: N/A; No date: LUNG BIOPSY No date: TONSILLECTOMY  BMI    Body Mass Index: 24.14 kg/m      Reproductive/Obstetrics negative OB ROS                             Anesthesia Physical Anesthesia Plan  ASA: 2  Anesthesia Plan: General   Post-op Pain Management:    Induction: Intravenous  PONV Risk Score and Plan: Propofol infusion and TIVA  Airway Management Planned: Natural Airway  Additional Equipment:   Intra-op Plan:   Post-operative Plan:   Informed Consent: I have reviewed the patients History and Physical, chart, labs and discussed the procedure including the risks, benefits and alternatives for the proposed anesthesia with the patient or authorized representative who has indicated his/her understanding and acceptance.     Dental Advisory Given  Plan Discussed with: CRNA and Surgeon  Anesthesia Plan Comments:        Anesthesia Quick Evaluation

## 2022-05-08 NOTE — Op Note (Signed)
[facility] Gastroenterology Patient Name: Alexis Mccarthy Procedure Date: 05/08/2022 8:06 AM MRN: [id number] Account #: [id number] Date of Birth: 16-Dec-1960 Admit Type: Outpatient Age: 62 Room: [facility] ENDO ROOM 1 Gender: Female Note Status: Finalized Instrument Name: Upper Endoscope [id number] Procedure:             Upper GI endoscopy Indications:           Dysphagia, Heartburn Providers:             Trenda Moots, DO Referring MD:          [name] DO, DO (Referring MD), Caryl Asp (Referring MD) Medicines:             Monitored Anesthesia Care Complications:         No immediate complications. Estimated blood loss:                         Minimal. Procedure:             Pre-Anesthesia Assessment:                        - Prior to the procedure, a History and Physical was                         performed, and patient medications and allergies were                         reviewed. The patient is competent. The risks and                         benefits of the procedure and the sedation options and                         risks were discussed with the patient. All questions                         were answered and informed consent was obtained.                         Patient identification and proposed procedure were                         verified by the physician, the nurse, the anesthetist                         and the technician in the endoscopy suite. Mental                         Status Examination: alert and oriented. Airway                         Examination: normal oropharyngeal airway and neck                         mobility. Respiratory Examination: clear to  auscultation. CV Examination: RRR, no murmurs, no S3                         or S4. Prophylactic Antibiotics: The patient does not                         require prophylactic antibiotics. Prior                          Anticoagulants: The patient has taken no anticoagulant                         or antiplatelet agents. ASA Grade Assessment: II - A                         patient with mild systemic disease. After reviewing                         the risks and benefits, the patient was deemed in                         satisfactory condition to undergo the procedure. The                         anesthesia plan was to use monitored anesthesia care                         (MAC). Immediately prior to administration of                         medications, the patient was re-assessed for adequacy                         to receive sedatives. The heart rate, respiratory                         rate, oxygen saturations, blood pressure, adequacy of                         pulmonary ventilation, and response to care were                         monitored throughout the procedure. The physical                         status of the patient was re-assessed after the                         procedure.                        After obtaining informed consent, the endoscope was                         passed under direct vision. Throughout the procedure,                         the patient's blood pressure, pulse, and oxygen  saturations were monitored continuously. The Endoscope                         was introduced through the mouth, and advanced to the                         second part of duodenum. The upper GI endoscopy was                         accomplished without difficulty. The patient tolerated                         the procedure well. Findings:      The duodenal bulb, first portion of the duodenum and second portion of       the duodenum were normal. Estimated blood loss: none.      Localized small patch mild inflammation characterized by erythema was       found in the gastric antrum. Biopsies were taken with a cold forceps for       histology. Estimated blood loss was minimal.       Multiple 1 to 8 mm sessile polyps with no bleeding and no stigmata of       recent bleeding were found in the gastric body. Biopsies were taken with       a cold forceps for histology. Estimated blood loss was minimal.      A 2 cm hiatal hernia was present. Estimated blood loss: none.      The Z-line was regular. Estimated blood loss: none.      Esophagogastric landmarks were identified: the gastroesophageal junction       was found at 35 cm from the incisors.      Normal mucosa was found in the entire esophagus. Biopsies were taken       with a cold forceps for histology. mid and distal esophagus to rule out       EOE. Estimated blood loss was minimal. The scope was withdrawn. Dilation       was performed with a Maloney dilator with no resistance at 52 Fr. The       dilation site was examined following endoscope reinsertion and showed       mild mucosal disruption. Mild disruption at UES, no change at LES/GEJ.       Estimated blood loss was minimal.      The exam of the esophagus was otherwise normal. Impression:            - Normal duodenal bulb, first portion of the duodenum                         and second portion of the duodenum.                        - Gastritis. Biopsied.                        - Multiple gastric polyps. Biopsied.                        - 2 cm hiatal hernia.                        - Z-line  regular.                        - Esophagogastric landmarks identified.                        - Normal mucosa was found in the entire esophagus.                         Biopsied. Dilated. Recommendation:        - Patient has a contact number available for                         emergencies. The signs and symptoms of potential                         delayed complications were discussed with the patient.                         Return to normal activities tomorrow. Written                         discharge instructions were provided to the patient.                         - Discharge patient to home.                        - Resume previous diet.                        - Continue present medications.                        - Await pathology results.                        - Repeat upper endoscopy PRN for retreatment.                        - Return to GI clinic as previously scheduled.                        - proceed with colonoscopy                        - The findings and recommendations were discussed with                         the patient. Procedure Code(s):     --- Professional ---                        [phone], Esophagogastroduodenoscopy, flexible,                         transoral; with biopsy, single or multiple                        43450, Dilation of esophagus, by unguided sound or  bougie, single or multiple passes Diagnosis Code(s):     --- Professional ---                        K29.70, Gastritis, unspecified, without bleeding                        K31.7, Polyp of stomach and duodenum                        K44.9, Diaphragmatic hernia without obstruction or                         gangrene                        R13.10, Dysphagia, unspecified                        R12, Heartburn CPT copyright 2022 [employer]. All rights reserved. The codes documented in this report are preliminary and upon coder review may  be revised to meet current compliance requirements. Attending Participation:      I personally performed the entire procedure. Elfredia NevinsSteven Korban Shearer, DO Jaynie CollinsSteven Michael Deriona Altemose DO, DO 05/08/2022 8:33:28 AM This report has been signed electronically. Number of Addenda: 0 Note Initiated On: 05/08/2022 8:06 AM Estimated Blood Loss:  Estimated blood loss was minimal.      [facility]

## 2022-05-11 ENCOUNTER — Encounter: Payer: Self-pay | Admitting: Gastroenterology

## 2022-05-11 LAB — SURGICAL PATHOLOGY

## 2022-05-19 ENCOUNTER — Other Ambulatory Visit: Payer: Self-pay | Admitting: Nurse Practitioner

## 2022-05-19 DIAGNOSIS — Z1231 Encounter for screening mammogram for malignant neoplasm of breast: Secondary | ICD-10-CM

## 2022-10-06 ENCOUNTER — Ambulatory Visit
Admission: RE | Admit: 2022-10-06 | Discharge: 2022-10-06 | Disposition: A | Discharge status: Home / Self Care | Payer: 59 | Source: Ambulatory Visit | Attending: Nurse Practitioner | Admitting: Nurse Practitioner

## 2022-10-06 DIAGNOSIS — Z1231 Encounter for screening mammogram for malignant neoplasm of breast: Secondary | ICD-10-CM | POA: Diagnosis present

## 2022-10-20 ENCOUNTER — Other Ambulatory Visit: Payer: Self-pay | Admitting: Internal Medicine

## 2022-10-20 DIAGNOSIS — R0789 Other chest pain: Secondary | ICD-10-CM

## 2022-10-21 ENCOUNTER — Ambulatory Visit
Admission: RE | Admit: 2022-10-21 | Discharge: 2022-10-21 | Disposition: A | Discharge status: Home / Self Care | Payer: 59 | Source: Ambulatory Visit | Attending: Internal Medicine | Admitting: Internal Medicine

## 2022-10-21 DIAGNOSIS — R0789 Other chest pain: Secondary | ICD-10-CM | POA: Insufficient documentation

## 2023-09-28 ENCOUNTER — Other Ambulatory Visit: Payer: Self-pay | Admitting: Internal Medicine

## 2023-09-28 DIAGNOSIS — Z1231 Encounter for screening mammogram for malignant neoplasm of breast: Secondary | ICD-10-CM

## 2023-10-26 ENCOUNTER — Ambulatory Visit

## 2023-11-23 ENCOUNTER — Ambulatory Visit
Admission: RE | Admit: 2023-11-23 | Discharge: 2023-11-23 | Disposition: A | Discharge status: Home / Self Care | Source: Ambulatory Visit | Attending: Internal Medicine | Admitting: Internal Medicine

## 2023-11-23 DIAGNOSIS — Z1231 Encounter for screening mammogram for malignant neoplasm of breast: Secondary | ICD-10-CM | POA: Insufficient documentation

## 2023-11-26 ENCOUNTER — Encounter: Payer: Self-pay | Admitting: Internal Medicine

## 2023-11-30 ENCOUNTER — Other Ambulatory Visit: Payer: Self-pay | Admitting: Internal Medicine

## 2023-11-30 DIAGNOSIS — R928 Other abnormal and inconclusive findings on diagnostic imaging of breast: Secondary | ICD-10-CM

## 2023-12-02 ENCOUNTER — Ambulatory Visit
Admission: RE | Admit: 2023-12-02 | Discharge: 2023-12-02 | Disposition: A | Discharge status: Home / Self Care | Source: Ambulatory Visit | Attending: Internal Medicine | Admitting: Internal Medicine

## 2023-12-02 DIAGNOSIS — R928 Other abnormal and inconclusive findings on diagnostic imaging of breast: Secondary | ICD-10-CM | POA: Diagnosis present

## 2023-12-07 ENCOUNTER — Other Ambulatory Visit: Payer: Self-pay | Admitting: Internal Medicine

## 2023-12-07 DIAGNOSIS — R921 Mammographic calcification found on diagnostic imaging of breast: Secondary | ICD-10-CM

## 2024-04-21 ENCOUNTER — Encounter: Admitting: Obstetrics

## 2024-06-02 ENCOUNTER — Other Ambulatory Visit

## 2024-06-02 ENCOUNTER — Encounter
